# Patient Record
Sex: Female | Born: 1935
Health system: Southern US, Community
[De-identification: ages and names within clinical notes are randomized; demographics above are authoritative.]

## PROBLEM LIST (undated history)

## (undated) DIAGNOSIS — M1990 Unspecified osteoarthritis, unspecified site: Secondary | ICD-10-CM

## (undated) DIAGNOSIS — C55 Malignant neoplasm of uterus, part unspecified: Secondary | ICD-10-CM

## (undated) DIAGNOSIS — H269 Unspecified cataract: Secondary | ICD-10-CM

## (undated) DIAGNOSIS — K921 Melena: Secondary | ICD-10-CM

## (undated) HISTORY — DX: Unspecified osteoarthritis, unspecified site: M19.90

## (undated) HISTORY — PX: ABDOMINAL HYSTERECTOMY: SHX81

## (undated) HISTORY — DX: Malignant neoplasm of uterus, part unspecified: C55

---

## 2012-11-14 ENCOUNTER — Ambulatory Visit (INDEPENDENT_AMBULATORY_CARE_PROVIDER_SITE_OTHER)
Admission: RE | Admit: 2012-11-14 | Discharge: 2012-11-14 | Disposition: A | Discharge status: Home / Self Care | Payer: Medicare (Managed Care) | Source: Ambulatory Visit | Attending: Gastroenterology | Admitting: Gastroenterology

## 2012-11-14 ENCOUNTER — Encounter: Payer: Self-pay | Admitting: Gastroenterology

## 2012-11-14 ENCOUNTER — Ambulatory Visit (INDEPENDENT_AMBULATORY_CARE_PROVIDER_SITE_OTHER): Payer: Medicare (Managed Care) | Admitting: Gastroenterology

## 2012-11-14 ENCOUNTER — Other Ambulatory Visit (INDEPENDENT_AMBULATORY_CARE_PROVIDER_SITE_OTHER): Payer: Medicare (Managed Care)

## 2012-11-14 VITALS — BP 160/66 | HR 76 | Ht 62.0 in | Wt 156.0 lb

## 2012-11-14 DIAGNOSIS — R6889 Other general symptoms and signs: Secondary | ICD-10-CM

## 2012-11-14 DIAGNOSIS — Z8541 Personal history of malignant neoplasm of cervix uteri: Secondary | ICD-10-CM

## 2012-11-14 DIAGNOSIS — R197 Diarrhea, unspecified: Secondary | ICD-10-CM

## 2012-11-14 DIAGNOSIS — K6289 Other specified diseases of anus and rectum: Secondary | ICD-10-CM

## 2012-11-14 DIAGNOSIS — R933 Abnormal findings on diagnostic imaging of other parts of digestive tract: Secondary | ICD-10-CM

## 2012-11-14 DIAGNOSIS — R198 Other specified symptoms and signs involving the digestive system and abdomen: Secondary | ICD-10-CM

## 2012-11-14 LAB — COMPREHENSIVE METABOLIC PANEL
AST: 16 U/L (ref 0–37)
Albumin: 3.3 g/dL — ABNORMAL LOW (ref 3.5–5.2)
BUN: 13 mg/dL (ref 6–23)
CO2: 26 mEq/L (ref 19–32)
Calcium: 9.4 mg/dL (ref 8.4–10.5)
Chloride: 105 mEq/L (ref 96–112)
Creatinine, Ser: 0.7 mg/dL (ref 0.4–1.2)
GFR: 79.16 mL/min (ref >60.00)
Glucose, Bld: 95 mg/dL (ref 70–99)
Potassium: 3.8 mEq/L (ref 3.5–5.1)

## 2012-11-14 LAB — CBC WITH DIFFERENTIAL/PLATELET
Basophils Relative: 0.3 % (ref 0.0–3.0)
Eosinophils Absolute: 0.2 10*3/uL (ref 0.0–0.7)
Eosinophils Relative: 2.7 % (ref 0.0–5.0)
HCT: 35 % — ABNORMAL LOW (ref 36.0–46.0)
Hemoglobin: 11.7 g/dL — ABNORMAL LOW (ref 12.0–15.0)
Lymphocytes Relative: 16.4 % (ref 12.0–46.0)
Lymphs Abs: 1.3 10*3/uL (ref 0.7–4.0)
MCHC: 33.3 g/dL (ref 30.0–36.0)
Neutro Abs: 5.6 10*3/uL (ref 1.4–7.7)
Neutrophils Relative %: 70.5 % (ref 43.0–77.0)
RBC: 3.79 Mil/uL — ABNORMAL LOW (ref 3.87–5.11)

## 2012-11-14 MED ORDER — IOHEXOL 300 MG/ML  SOLN
100.0000 mL | Freq: Once | INTRAMUSCULAR | Status: AC | PRN
  Administered 2012-11-14: 100 mL via INTRAVENOUS

## 2012-11-14 MED ORDER — MOVIPREP 100 G PO SOLR: 1.0000 | Freq: Once | ORAL | Status: DC

## 2012-11-14 NOTE — Patient Instructions (Addendum)
You will have labs checked today in the basement lab.  Please head down after you check out with the front desk  (cbc, cmet, cea). You will be set up for a CT scan of abdomen and pelvis with IV and oral contrast and also chest CT with IV contrast for newly diagnosed rectal mass.  You have been scheduled for a CT scan of the abdomen and pelvis at [facility] ([address]---this is in the same building as Architectural technologist).   You are scheduled TODAY at 3 pm. You should arrive 15 minutes prior to your appointment time for registration. Please follow the written instructions below on the day of your exam:  WARNING: IF YOU ARE ALLERGIC TO IODINE/X-RAY DYE, PLEASE NOTIFY RADIOLOGY IMMEDIATELY AT [phone]! YOU WILL BE GIVEN A 13 HOUR PREMEDICATION PREP.  1) Do not eat or drink anything after 11 am (4 hours prior to your test) 2) You have been given 2 bottles of oral contrast to drink. The solution may taste better if refrigerated, but do NOT add ice or any other liquid to this solution. Shake well before drinking.    Drink 1 bottle of contrast @ 1 pm (2 hours prior to your exam)  Drink 1 bottle of contrast @ 2 pm (1 hour prior to your exam)  You may take any medications as prescribed with a small amount of water except for the following: Metformin, Glucophage, Glucovance, Avandamet, Riomet, Fortamet, Actoplus Met, Janumet, Glumetza or Metaglip. The above medications must be held the day of the exam AND 48 hours after the exam.  The purpose of you drinking the oral contrast is to aid in the visualization of your intestinal tract. The contrast solution may cause some diarrhea. Before your exam is started, you will be given a small amount of fluid to drink. Depending on your individual set of symptoms, you may also receive an intravenous injection of x-ray contrast/dye. Plan on being at [facility] for 30 minutes or long, depending on the type of exam you are having  performed.  This test typically takes 30-45 minutes to complete.  If you have any questions regarding your exam or if you need to reschedule, you may call the CT department at [phone] between the hours of 8:00 am and 5:00 pm, Monday-Friday.  ________________________________________________________________________  Bonita Quin will be set up for a colonoscopy +/- lower EUS at [facility] this Thursday (++MAC).

## 2012-11-14 NOTE — Progress Notes (Signed)
HPI: This is a  very pleasant 79 year old woman whom I am meeting for the first time today.  She is here with her daughter.  Lives in [place] during warm months and here in [place] during winter.  Her granddaughter is a good friend of Dr. Harden Mo who called me yesterday to help expedite her care.  She had uterine cancer, 12-15 years. She had surgery and XRT in [facility].  She is felt to have been cured.  She has had urgency with her bowels, loose stools. Has been going on for at least several months. There can be some overt red bleeding.  Minor ache at bottom, but no serious anal pains. No weight loss.    Never had colonoscopy.  No colon cancer in her family.  Was at her gyne surg office this past week, rectal examination done, felt to have rectal mass.   Review of systems: Pertinent positive and negative review of systems were noted in the above HPI section. Complete review of systems was performed and was otherwise normal.    Past Medical History  Diagnosis Date  . Arthritis   . Uterine cancer     History reviewed. No pertinent past surgical history.  Current Outpatient Prescriptions  Medication Sig Dispense Refill  . Cholecalciferol (VITAMIN D PO) Take by mouth. As directed      . Thiamine HCl (VITAMIN B-1 PO) Take by mouth. As directed       No current facility-administered medications for this visit.    Allergies as of 11/14/2012  . (No Known Allergies)    Family History  Problem Relation Age of Onset  . Uterine cancer Mother     History   Social History  . Marital Status: Widowed    Spouse Name: N/A    Number of Children: N/A  . Years of Education: N/A   Occupational History  . Not on file.   Social History Main Topics  . Smoking status: Never Smoker   . Smokeless tobacco: Not on file  . Alcohol Use: No  . Drug Use: No  . Sexual Activity: Not on file   Other Topics Concern  . Not on file   Social History Narrative  . No narrative on file        Physical Exam: BP 160/66  Pulse 76  Ht 5\' 2"  (1.575 m)  Wt 156 lb (70.761 kg)  BMI 28.53 kg/m2 Constitutional: Elderly but otherwise  generally well-appearing Psychiatric: alert and oriented x3, very minor memory difficulty Eyes: extraocular movements intact Mouth: oral pharynx moist, no lesions Neck: supple no lymphadenopathy Cardiovascular: heart regular rate and rhythm Lungs: clear to auscultation bilaterally Abdomen: soft, nontender, nondistended, no obvious ascites, no peritoneal signs, normal bowel sounds Extremities: no lower extremity edema bilaterally Skin: no lesions on visible extremities Rectal examination with female assistant in the room: Brown stools soiling the perineum, she is wearing an adult diaper. Small external anal hemorrhoids, there is a firm mass in the mid to distal rectum. The distal edge of this lady's about 5 cm from the anal verge on my examination. It does not seem to be circumferential on this exam but there was quite a lot of stool there so I cannot be certain.   Assessment and plan: 79 y.o. female with  likely rectal cancer  I will get labs today including CBC, complete metabolic profile, CEA level. I am arranging a staging workup including chest, abdomen, pelvic CT scan. She needs colonoscopy which I have arranged  for this Thursday and pending the results of her CT staging workup, I would also perform lower endoscopic ultrasound if it is appropriate (no overt metastatic disease on CT). Following these tests I will begin referral process to medical, surgical, radiation oncology as appropriate.

## 2012-11-15 ENCOUNTER — Telehealth (INDEPENDENT_AMBULATORY_CARE_PROVIDER_SITE_OTHER): Payer: Self-pay

## 2012-11-15 ENCOUNTER — Encounter (HOSPITAL_COMMUNITY): Payer: Self-pay | Admitting: Pharmacy Technician

## 2012-11-15 ENCOUNTER — Encounter (HOSPITAL_COMMUNITY): Payer: Self-pay | Admitting: *Deleted

## 2012-11-15 DIAGNOSIS — H269 Unspecified cataract: Secondary | ICD-10-CM

## 2012-11-15 DIAGNOSIS — K921 Melena: Secondary | ICD-10-CM

## 2012-11-15 HISTORY — DX: Melena: K92.1

## 2012-11-15 HISTORY — DX: Unspecified cataract: H26.9

## 2012-11-15 LAB — CEA: CEA: 12.8 ng/mL — ABNORMAL HIGH (ref 0.0–5.0)

## 2012-11-15 NOTE — Telephone Encounter (Signed)
Pt has appt with Dr. Christella Hartigan on 10/16 for a colonoscopy.  Will call her daughter tomorrow to discuss scheduling an appointment with Dr. Donell Beers.

## 2012-11-16 ENCOUNTER — Ambulatory Visit (HOSPITAL_COMMUNITY): Payer: Medicare (Managed Care) | Admitting: Anesthesiology

## 2012-11-16 ENCOUNTER — Encounter (HOSPITAL_COMMUNITY)
Admission: RE | Disposition: A | Discharge status: Home / Self Care | Payer: Self-pay | Source: Ambulatory Visit | Attending: Gastroenterology

## 2012-11-16 ENCOUNTER — Encounter (HOSPITAL_COMMUNITY): Payer: Self-pay

## 2012-11-16 ENCOUNTER — Telehealth: Payer: Self-pay

## 2012-11-16 ENCOUNTER — Ambulatory Visit (HOSPITAL_COMMUNITY)
Admission: RE | Admit: 2012-11-16 | Discharge: 2012-11-16 | Disposition: A | Discharge status: Home / Self Care | Payer: Medicare (Managed Care) | Source: Ambulatory Visit | Attending: Gastroenterology | Admitting: Gastroenterology

## 2012-11-16 ENCOUNTER — Encounter: Payer: Self-pay | Admitting: Radiation Oncology

## 2012-11-16 ENCOUNTER — Encounter (HOSPITAL_COMMUNITY): Payer: Medicare (Managed Care) | Admitting: Anesthesiology

## 2012-11-16 DIAGNOSIS — R198 Other specified symptoms and signs involving the digestive system and abdomen: Secondary | ICD-10-CM

## 2012-11-16 DIAGNOSIS — Z8542 Personal history of malignant neoplasm of other parts of uterus: Secondary | ICD-10-CM | POA: Insufficient documentation

## 2012-11-16 DIAGNOSIS — R933 Abnormal findings on diagnostic imaging of other parts of digestive tract: Secondary | ICD-10-CM

## 2012-11-16 DIAGNOSIS — R6889 Other general symptoms and signs: Secondary | ICD-10-CM

## 2012-11-16 DIAGNOSIS — M129 Arthropathy, unspecified: Secondary | ICD-10-CM | POA: Insufficient documentation

## 2012-11-16 DIAGNOSIS — Z8541 Personal history of malignant neoplasm of cervix uteri: Secondary | ICD-10-CM

## 2012-11-16 DIAGNOSIS — C20 Malignant neoplasm of rectum: Secondary | ICD-10-CM | POA: Insufficient documentation

## 2012-11-16 DIAGNOSIS — K6289 Other specified diseases of anus and rectum: Secondary | ICD-10-CM

## 2012-11-16 DIAGNOSIS — Z1211 Encounter for screening for malignant neoplasm of colon: Secondary | ICD-10-CM | POA: Insufficient documentation

## 2012-11-16 DIAGNOSIS — K5730 Diverticulosis of large intestine without perforation or abscess without bleeding: Secondary | ICD-10-CM | POA: Insufficient documentation

## 2012-11-16 DIAGNOSIS — R197 Diarrhea, unspecified: Secondary | ICD-10-CM

## 2012-11-16 HISTORY — PX: EUS: SHX5427

## 2012-11-16 HISTORY — DX: Unspecified cataract: H26.9

## 2012-11-16 HISTORY — PX: COLONOSCOPY WITH PROPOFOL: SHX5780

## 2012-11-16 HISTORY — DX: Melena: K92.1

## 2012-11-16 SURGERY — COLONOSCOPY WITH PROPOFOL
Anesthesia: Monitor Anesthesia Care

## 2012-11-16 MED ORDER — SPOT INK MARKER SYRINGE KIT
PACK | SUBMUCOSAL | Status: AC
  Filled 2012-11-16: qty 5

## 2012-11-16 MED ORDER — KETAMINE HCL 10 MG/ML IJ SOLN
INTRAMUSCULAR | Status: DC | PRN
  Administered 2012-11-16: 10 mg via INTRAVENOUS

## 2012-11-16 MED ORDER — SODIUM CHLORIDE 0.9 % IV SOLN: INTRAVENOUS | Status: DC

## 2012-11-16 MED ORDER — SPOT INK MARKER SYRINGE KIT
PACK | SUBMUCOSAL | Status: DC | PRN
  Administered 2012-11-16: 2 mL via SUBMUCOSAL

## 2012-11-16 MED ORDER — LACTATED RINGERS IV SOLN
INTRAVENOUS | Status: DC
  Administered 2012-11-16: 1000 mL via INTRAVENOUS

## 2012-11-16 MED ORDER — PROPOFOL 10 MG/ML IV BOLUS
INTRAVENOUS | Status: DC | PRN
  Administered 2012-11-16: 25 mg via INTRAVENOUS
  Administered 2012-11-16: 15 mg via INTRAVENOUS
  Administered 2012-11-16: 10 mg via INTRAVENOUS
  Administered 2012-11-16: 25 mg via INTRAVENOUS

## 2012-11-16 SURGICAL SUPPLY — 21 items

## 2012-11-16 NOTE — Telephone Encounter (Signed)
Message copied by Donata Duff on Thu Nov 16, 2012 11:58 AM ------      Message from: Rob Bunting P      Created: Thu Nov 16, 2012 11:33 AM       Alexia Freestone,      She needs referral to Drs. Mancel Bale and Dorothy Puffer at [facility] center for newly diagnosed (T3N0) rectal cancer. She should already have appt upcoming with Dr. Donell Beers.              Almira Coaster,       Can you put her on for Gi tumor board in 2 weeks (I'm out of town next week). Thanks            Matt,      [place]                  IMPRESSION:      5cm long, circumferential, uT3N0 rectal cancer with distal edge 1.5-2cm from the anal verge. The colon was otherwise normal. The rectal mass was biopsied and labled with [place] Ink injection.         ------

## 2012-11-16 NOTE — Anesthesia Preprocedure Evaluation (Addendum)
Anesthesia Evaluation  Patient identified by MRN, date of birth, ID band Patient awake    Reviewed: Allergy & Precautions, H&P , NPO status , Patient's Chart, lab work & pertinent test results  Airway Mallampati: III TM Distance: >3 FB Neck ROM: Full    Dental  (+) Teeth Intact and Dental Advisory Given   Pulmonary neg pulmonary ROS,  breath sounds clear to auscultation  Pulmonary exam normal       Cardiovascular negative cardio ROS  Rhythm:Regular Rate:Normal     Neuro/Psych negative neurological ROS  negative psych ROS   GI/Hepatic negative GI ROS, Neg liver ROS,   Endo/Other  negative endocrine ROS  Renal/GU negative Renal ROS   Hx of uterine CA    Musculoskeletal  (+) Arthritis -,   Abdominal   Peds  Hematology  (+) Blood dyscrasia, anemia ,   Anesthesia Other Findings   Reproductive/Obstetrics negative OB ROS                          Anesthesia Physical Anesthesia Plan  ASA: II  Anesthesia Plan: MAC   Post-op Pain Management:    Induction: Intravenous  Airway Management Planned: Nasal Cannula and Simple Face Mask  Additional Equipment:   Intra-op Plan:   Post-operative Plan:   Informed Consent: I have reviewed the patients History and Physical, chart, labs and discussed the procedure including the risks, benefits and alternatives for the proposed anesthesia with the patient or authorized representative who has indicated his/her understanding and acceptance.   Dental advisory given  Plan Discussed with: CRNA  Anesthesia Plan Comments:         Anesthesia Quick Evaluation

## 2012-11-16 NOTE — Telephone Encounter (Signed)
Stephanie Burns referrals are in [facility]

## 2012-11-16 NOTE — Op Note (Signed)
[facility] [address] [place], [address]   COLONOSCOPY PROCEDURE REPORT  PATIENT: Stephanie, Burns  MR#: [id number] BIRTHDATE: Oct 31, 1918 , 89  yrs. old GENDER: Female ENDOSCOPIST: Rachael Fee, MD REFERRED XB:[id number] Dwain Sarna, M.D. PROCEDURE DATE:  11/16/2012 PROCEDURE:   Colonoscopy with biopsy, Colonoscopy with EUS, and Submucosal injection, any substance First Screening Colonoscopy - Avg.  risk and is 50 yrs.  old or older - No.  Prior Negative Screening - Now for repeat screening. N/A  History of Adenoma - Now for follow-up colonoscopy & has been > or = to 3 yrs.  N/A  Polyps Removed Today? No.  Recommend repeat exam, <10 yrs? Yes.  High risk (family or personal hx). ASA CLASS:   Class III INDICATIONS:rectal mass; no sign of metastatic disease on CT scans.  MEDICATIONS: MAC sedation, administered by CRNA DESCRIPTION OF PROCEDURE:   After the risks benefits and alternatives of the procedure were thoroughly explained, informed consent was obtained.  A digital rectal exam revealed a palpable rectal mass.   The Pentax Pediatric Colonoscope [id number]  endoscope was introduced through the anus and advanced to the cecum, which was identified by both the appendix and ileocecal valve. No adverse events experienced.   The quality of the prep was good.  The instrument was then slowly withdrawn as the colon was fully examined.  COLON FINDINGS: There was a circumferntial, partially obstructing, clearly malignant appearing mass in distal rectum.  The mass is 5cm long, distal edge is 1.5-2cm from the anal verge.  There were diverticulum in the left colon.  The colon was otherwise normal. The rectal mass was sampled with mucosal biopsies and then the proximal and distal margins of the mass were labled with injection of [place] Ink. Following endoscopic examination, the radial echoendoscope was inserted.  The mass described above correlated with a  hypoechoic, heterogeneous mass that clearly passed into and through the muscularis propria layer of the rectal wall (uT3).  The mass was 2cm in thickness.  There were no suspicious perirectal lymphnodes (uN0).  Retroflexion was not performed. The time to cecum=3 minutes 00 seconds.  Withdrawal time=10 minutes 00 seconds.  The scope was withdrawn and the procedure completed. COMPLICATIONS: There were no complications. IMPRESSION: 5cm long, circumferential, uT3N0 rectal cancer with distal edge 1.5-2cm from the anal verge. The colon was otherwise normal. The rectal mass was biopsied and labled with [place] Ink injection.  RECOMMENDATIONS: Await final biopsy report.  She will likely benefit from neo-adjuvant chemo/XRT.  My office will arrange referral to Drs. [place] and [place]. She is already scheduled to meet with Dr. Donell Beers.  eSigned:  Rachael Fee, MD 11/16/2012 11:31 AM  cc:  Almond Lint, MD

## 2012-11-16 NOTE — Preoperative (Signed)
Beta Blockers   Reason not to administer Beta Blockers:Not Applicable 

## 2012-11-16 NOTE — Interval H&P Note (Signed)
History and Physical Interval Note:  11/16/2012 9:50 AM  Stephanie Burns  has presented today for surgery, with the diagnosis of Rectal mass [787.99]  The various methods of treatment have been discussed with the patient and family. After consideration of risks, benefits and other options for treatment, the patient has consented to  Procedure(s) with comments: COLONOSCOPY WITH PROPOFOL (N/A) LOWER ENDOSCOPIC ULTRASOUND (EUS) (N/A) - Possible EUS as a surgical intervention .  The patient's history has been reviewed, patient examined, no change in status, stable for surgery.  I have reviewed the patient's chart and labs.  Questions were answered to the patient's satisfaction.     Rachael Fee

## 2012-11-16 NOTE — Anesthesia Postprocedure Evaluation (Signed)
Anesthesia Post Note  Patient: Stephanie Burns  Procedure(s) Performed: Procedure(s) (LRB): COLONOSCOPY WITH PROPOFOL (N/A) LOWER ENDOSCOPIC ULTRASOUND (EUS) (N/A)  Anesthesia type: MAC  Patient location: PACU  Post pain: Pain level controlled  Post assessment: Post-op Vital signs reviewed  Last Vitals:  Filed Vitals:   11/16/12 1150  BP: 162/69  Pulse:   Temp:   Resp: 19    Post vital signs: Reviewed  Level of consciousness: sedated  Complications: No apparent anesthesia complications

## 2012-11-16 NOTE — H&P (View-Only) (Signed)
HPI: This is a  very pleasant 79-year-old woman whom I am meeting for the first time today.  She is here with her daughter.  Lives in [place] during warm months and here in [place] during winter.  Her granddaughter is a good friend of Dr. Matt Wakefield who called me yesterday to help expedite her care.  She had uterine cancer, 12-15 years. She had surgery and XRT in [place].  She is felt to have been cured.  She has had urgency with her bowels, loose stools. Has been going on for at least several months. There can be some overt red bleeding.  Minor ache at bottom, but no serious anal pains. No weight loss.    Never had colonoscopy.  No colon cancer in her family.  Was at her gyne surg office this past week, rectal examination done, felt to have rectal mass.   Review of systems: Pertinent positive and negative review of systems were noted in the above HPI section. Complete review of systems was performed and was otherwise normal.    Past Medical History  Diagnosis Date  . Arthritis   . Uterine cancer     History reviewed. No pertinent past surgical history.  Current Outpatient Prescriptions  Medication Sig Dispense Refill  . Cholecalciferol (VITAMIN D PO) Take by mouth. As directed      . Thiamine HCl (VITAMIN B-1 PO) Take by mouth. As directed       No current facility-administered medications for this visit.    Allergies as of 11/14/2012  . (No Known Allergies)    Family History  Problem Relation Age of Onset  . Uterine cancer Mother     History   Social History  . Marital Status: Widowed    Spouse Name: N/A    Number of Children: N/A  . Years of Education: N/A   Occupational History  . Not on file.   Social History Main Topics  . Smoking status: Never Smoker   . Smokeless tobacco: Not on file  . Alcohol Use: No  . Drug Use: No  . Sexual Activity: Not on file   Other Topics Concern  . Not on file   Social History Narrative  . No narrative on file        Physical Exam: BP 160/66  Pulse 76  Ht 5' 2" (1.575 m)  Wt 156 lb (70.761 kg)  BMI 28.53 kg/m2 Constitutional: Elderly but otherwise  generally well-appearing Psychiatric: alert and oriented x3, very minor memory difficulty Eyes: extraocular movements intact Mouth: oral pharynx moist, no lesions Neck: supple no lymphadenopathy Cardiovascular: heart regular rate and rhythm Lungs: clear to auscultation bilaterally Abdomen: soft, nontender, nondistended, no obvious ascites, no peritoneal signs, normal bowel sounds Extremities: no lower extremity edema bilaterally Skin: no lesions on visible extremities Rectal examination with female assistant in the room: Brown stools soiling the perineum, she is wearing an adult diaper. Small external anal hemorrhoids, there is a firm mass in the mid to distal rectum. The distal edge of this lady's about 5 cm from the anal verge on my examination. It does not seem to be circumferential on this exam but there was quite a lot of stool there so I cannot be certain.   Assessment and plan: 79 y.o. female with  likely rectal cancer  I will get labs today including CBC, complete metabolic profile, CEA level. I am arranging a staging workup including chest, abdomen, pelvic CT scan. She needs colonoscopy which I have arranged   for this Thursday and pending the results of her CT staging workup, I would also perform lower endoscopic ultrasound if it is appropriate (no overt metastatic disease on CT). Following these tests I will begin referral process to medical, surgical, radiation oncology as appropriate.     

## 2012-11-16 NOTE — Progress Notes (Signed)
GI Location of Tumor / Histology: Rectal mass  Patient presented several  months  Urgency,loose stools,some overt  Red bleeding,minor aching on bottom  Biopsies of rectal mass 11/16/12:Invasive poorly differentiated Adenocarcinoma Past/Anticipated interventions by surgeon, if ZOX:[id number] with biopsy 11/16/12 Dr. Rob Bunting, scheduled to see [facility] 11/17/12  Past/Anticipated interventions by medical oncology, if XBJ:YNWG Dr.Sherrill 11/24/12 Weight changes, if any: no Bowel/Bladder complaints, if any: loose stools, urgency,  Nausea / Vomiting, if any:No Pain issues, if any:No   SAFETY ISSUES:  Prior radiation?yes, Uterine cancer 12-15 years ago, in [place]  Pacemaker/ICD?no  Possible current pregnancy?no  Is the patient on methotrexate? No     Current Complaints / other details: widowed,lives in [place] summers, here during winters with daughter, no colon cancer in family non smoker,non drinker

## 2012-11-16 NOTE — Transfer of Care (Signed)
Immediate Anesthesia Transfer of Care Note  Patient: Stephanie Burns  Procedure(s) Performed: Procedure(s) with comments: COLONOSCOPY WITH PROPOFOL (N/A) LOWER ENDOSCOPIC ULTRASOUND (EUS) (N/A) - Possible EUS  Patient Location: PACU and Endoscopy Unit  Anesthesia Type:MAC  Level of Consciousness: awake, alert , oriented and patient cooperative  Airway & Oxygen Therapy: Patient Spontanous Breathing and Patient connected to face mask oxygen  Post-op Assessment: Report given to PACU RN, Post -op Vital signs reviewed and stable and Patient moving all extremities  Post vital signs: Reviewed and stable  Complications: No apparent anesthesia complications

## 2012-11-17 ENCOUNTER — Encounter (HOSPITAL_COMMUNITY): Payer: Self-pay | Admitting: Gastroenterology

## 2012-11-17 ENCOUNTER — Ambulatory Visit (INDEPENDENT_AMBULATORY_CARE_PROVIDER_SITE_OTHER): Payer: PRIVATE HEALTH INSURANCE | Admitting: General Surgery

## 2012-11-17 VITALS — BP 160/84 | HR 80 | Temp 98.4°F | Resp 15 | Ht 62.0 in | Wt 156.4 lb

## 2012-11-17 DIAGNOSIS — C20 Malignant neoplasm of rectum: Secondary | ICD-10-CM

## 2012-11-17 NOTE — Patient Instructions (Signed)
See Dr. Mitzi Hansen and Dr. Truett Perna next week.  We will also discuss you at GI conference next week.    Dr. Truett Perna will go over the final plan when you see him next Friday.

## 2012-11-17 NOTE — Assessment & Plan Note (Addendum)
Given the fact that this rectal cancer is still low, I think that she would need an APR. Because of her age and her patulous sphincter tone, did not think she would do well with attempt at sphincter sparing surgery.  I think the best plan would be to do chemoradiation and see if we get complete clinical response. Think that if we can get a good response, she would be reasonable to hold off on surgery.  She is reasonably frail, and I think that she would be at very high risk for prolonged post op recovery and complications.    Because of her prior uterine radiation, she may not be able to receive radiation therapy. I will see what Dr. Mitzi Hansen says after his evaluation.  30 min spent in evaluation, examination, counseling, and coordination of care.    We'll discuss her next week in GI conference.

## 2012-11-17 NOTE — Progress Notes (Signed)
Chief Complaint  Patient presents with  . New Evaluation    per dr Carita Sollars/ rectal cancer    HISTORY: Patient is a 79 year old female with a history of uterine cancer who presented with loose stools and fecal urgency.  She had never undergone colonoscopy. On physical exam, she was found to have a rectal mass. She underwent endoscopic ultrasound and colonoscopy yesterday. She did have a malignant appearing mass at 1.5 cm from the anal verge. This was found to be an ultrasound T3 lesion. She did not specifically have rectal pain, but does have discomfort, aching, and spasm. She denies any obstructive symptoms. She also denies bloating. She has had some bloody stools. She thinks she may have lost a few pounds, but her close did not seem to fit that much looser. She does feel a little bit weaker than she had previously, that she has attributed this to age. She lives in [place] during the summer and comes down here and lives with her daughter during the winter.  Past Medical History  Diagnosis Date  . Passage of bloody stools 11-15-12    occ.  . Arthritis     fingers  . Uterine cancer     12-15 years ago  . Cataract 11-15-12    one eye    Past Surgical History  Procedure Laterality Date  . Abdominal hysterectomy    . Colonoscopy with propofol N/A 11/16/2012    Procedure: COLONOSCOPY WITH PROPOFOL;  Surgeon: Rachael Fee, MD;  Location: [facility];  Service: Endoscopy;  Laterality: N/A;  . Eus N/A 11/16/2012    Procedure: LOWER ENDOSCOPIC ULTRASOUND (EUS);  Surgeon: Rachael Fee, MD;  Location: [facility];  Service: Endoscopy;  Laterality: N/A;  Possible EUS    Current Outpatient Prescriptions  Medication Sig Dispense Refill  . Cholecalciferol (VITAMIN D-3) 1000 UNITS CAPS Take 1,000 Units by mouth daily.      . vitamin B-12 (CYANOCOBALAMIN) 1000 MCG tablet Take 1,000 mcg by mouth daily.       No current facility-administered medications for this visit.     No Known  Allergies   Family History  Problem Relation Age of Onset  . Uterine cancer Mother   . Cancer Mother     cant remember     History   Social History  . Marital Status: Widowed    Spouse Name: N/A    Number of Children: N/A  . Years of Education: N/A   Social History Main Topics  . Smoking status: Never Smoker   . Smokeless tobacco: Never Used  . Alcohol Use: No     Comment: very rare wine  . Drug Use: No  . Sexual Activity: Not Currently    REVIEW OF SYSTEMS - PERTINENT POSITIVES ONLY: 12 point review of systems negative other than HPI and PMH except for rectal discomfort and leg weakness.    [facility]:   11/17/12 0925  BP: 160/84  Pulse: 80  Temp: 98.4 F (36.9 C)  Resp: 15   Filed Weights   11/17/12 0925  Weight: 156 lb 6.4 oz (70.943 kg)     Gen:  No acute distress.  Well nourished and well groomed.   Neurological: Alert and oriented to person, place, and time. Moves slowly.    Head: Normocephalic and atraumatic.  Eyes: Conjunctivae are normal. Pupils are equal, round, and reactive to light. No scleral icterus.  Neck: Normal range of motion. Neck supple. No tracheal deviation or thyromegaly present.  Cardiovascular: Normal rate, regular rhythm, and intact distal pulses.  Exam reveals no gallop and no friction rub. +3 systolic murmur.   Respiratory: Effort normal.  No respiratory distress. No chest wall tenderness. Breath sounds normal.  No wheezes, rales or rhonchi.  GI: Soft. Bowel sounds are normal. The abdomen is soft and nontender.  There is no rebound and no guarding.  Musculoskeletal: Normal range of motion. Extremities are nontender.  Lymphadenopathy: No cervical, preauricular, postauricular or axillary adenopathy is present Skin: Skin is warm and dry. No rash noted. No diaphoresis. No erythema. No pallor. No clubbing, cyanosis, or edema.   Psychiatric: Normal mood and affect. Behavior is normal. Judgment and thought content normal.     LABORATORY RESULTS: Available labs are reviewed  Albumin 3.3, Electrolytes normal, HCT 35.0, CEA 12.8  RADIOLOGY RESULTS: See E-Chart or I-Site for most recent results.  Images and reports are reviewed. CT chest/abd/pelvis \\IMPRESSION :  1. Calcified left hilar lymph nodes suggest prior granulomatous  disease or other benign process.  2. Moderate wall thickening of most of the rectum. Findings are  concerning for malignancy. Infection or nonspecific inflammatory  change of the bowel is considered less likely    ASSESSMENT AND PLAN: Rectal cancer Given the fact that this rectal cancer is still low, I think that she would need an APR. Because of her age and her patulous sphincter tone, did not think she would do well with attempt at sphincter sparing surgery.  I think the best plan would be to do chemoradiation and see if we get complete clinical response. Think that if we can get a good response, she would be reasonable to hold off on surgery.  She is reasonably frail, and I think that she would be at very high risk for prolonged post op recovery and complications.    Because of her prior uterine radiation, she may not be able to receive radiation therapy. I will see what Dr. Mitzi Hansen says after his evaluation.  30 min spent in evaluation, examination, counseling, and coordination of care.    We'll discuss her next week in GI conference.      Maudry Diego MD Surgical Oncology, General and Endocrine Surgery [facility]      Visit Diagnoses: 1. Rectal cancer     Primary Care Physician: Carlis Abbott, MD

## 2012-11-20 ENCOUNTER — Telehealth: Payer: Self-pay | Admitting: *Deleted

## 2012-11-20 ENCOUNTER — Ambulatory Visit
Admission: RE | Admit: 2012-11-20 | Discharge: 2012-11-20 | Disposition: A | Discharge status: Home / Self Care | Payer: Medicare (Managed Care) | Source: Ambulatory Visit | Attending: Radiation Oncology | Admitting: Radiation Oncology

## 2012-11-20 ENCOUNTER — Encounter: Payer: Self-pay | Admitting: Radiation Oncology

## 2012-11-20 ENCOUNTER — Ambulatory Visit: Payer: Medicare (Managed Care)

## 2012-11-20 VITALS — BP 176/62 | HR 80 | Temp 97.6°F | Resp 20 | Wt 155.0 lb

## 2012-11-20 DIAGNOSIS — C20 Malignant neoplasm of rectum: Secondary | ICD-10-CM

## 2012-11-20 DIAGNOSIS — Z923 Personal history of irradiation: Secondary | ICD-10-CM | POA: Insufficient documentation

## 2012-11-20 DIAGNOSIS — Z8542 Personal history of malignant neoplasm of other parts of uterus: Secondary | ICD-10-CM | POA: Insufficient documentation

## 2012-11-20 NOTE — Progress Notes (Signed)
Please see the Nurse Progress Note in the MD Initial Consult Encounter for this patient. 

## 2012-11-20 NOTE — Telephone Encounter (Signed)
Left voice message for patient home phone [phone] asking clarification of where she reeceived her radiation tx years ago, per note stated "[facility]",  Per Oretha Milch message she had a call stating no films from [facility] 11:12 AM

## 2012-11-20 NOTE — Progress Notes (Signed)
Radiation Oncology         [phone] ________________________________  Name: BURNA ATLAS MRN: [id number]  Date: 11/20/2012  DOB: 1918-10-26  [id number], Zenaida Niece, MD  Rachael Fee, MD     REFERRING PHYSICIAN: Rachael Fee, MD   DIAGNOSIS: The encounter diagnosis was Rectal cancer.   HISTORY OF PRESENT ILLNESS::Stephanie Burns is a 79 y.o. female who is seen for an initial consultation visit. The patient is a had a history of some loose stools and fecal urgency. This led to further workup and on exam the patient was found to have a rectal mass.  The patient proceeded with a colonoscopy with biopsy well as an endorectal ultrasound. This revealed a 5 cm long circumferential tumor with the distal edge approximately 1-1/2-2 cm from the anal verge. The rectal mass was biopsied and labeled with any. Based on ultrasound this represented a T3 N0 tumor.  The patient further underwent CT imaging with a scan of the chest/abdomen/pelvis. This was most significant for moderate wall thickening of most of the rectum which was consistent with malignancy. The patient today states that she has in general been in good health. She maintains her house and does her grocery shopping. The patient describes a pressure in the anal region associated with some rectal urgency. Some occasional blood per act him with bowel movements.     PREVIOUS RADIATION THERAPY: Yes the patient was treated with radiation for uterine cancer approximately 12-15 years ago in [facility]. The radiation records for this treatment have been requested. From discussing this with the patient it appears that she was treated for approximately 6 weeks and I am assuming from her description that she received whole pelvic external beam radiation as part of her overall treatment plan.   PAST MEDICAL HISTORY:  has a past medical history of Passage of bloody stools (11-15-12); Arthritis; Uterine cancer; and Cataract (11-15-12).      PAST SURGICAL HISTORY: Past Surgical History  Procedure Laterality Date  . Abdominal hysterectomy    . Colonoscopy with propofol N/A 11/16/2012    Procedure: COLONOSCOPY WITH PROPOFOL;  Surgeon: Rachael Fee, MD;  [facility]: [facility];  Service: Endoscopy;  Laterality: N/A;  . Eus N/A 11/16/2012    Procedure: LOWER ENDOSCOPIC ULTRASOUND (EUS);  Surgeon: Rachael Fee, MD;  Location: [facility];  Service: Endoscopy;  Laterality: N/A;  Possible EUS     FAMILY HISTORY: family history includes Cancer in her mother; Uterine cancer in her mother.   SOCIAL HISTORY:  reports that she has never smoked. She has never used smokeless tobacco. She reports that she does not drink alcohol or use illicit drugs.   ALLERGIES: Review of patient's allergies indicates no known allergies.   MEDICATIONS:  Current Outpatient Prescriptions  Medication Sig Dispense Refill  . Cholecalciferol (VITAMIN D-3) 1000 UNITS CAPS Take 1,000 Units by mouth daily.      . vitamin B-12 (CYANOCOBALAMIN) 1000 MCG tablet Take 1,000 mcg by mouth daily.       No current facility-administered medications for this encounter.     REVIEW OF SYSTEMS:  A 15 point review of systems is documented in the electronic medical record. This was obtained by the nursing staff. However, I reviewed this with the patient to discuss relevant findings and make appropriate changes.  Pertinent items are noted in HPI.    PHYSICAL EXAM:  weight is 155 lb (70.308 kg). Her temperature is 97.6 F (36.4 C). Her blood pressure is 176/62 and  her pulse is 80. Her respiration is 20.   General: Well-developed, in no acute distress HEENT: Normocephalic, atraumatic; oral cavity clear Neck: Supple without any lymphadenopathy Cardiovascular: Regular rate and rhythm Respiratory: Clear to auscultation bilaterally GI: Soft, nontender, normal bowel sounds Extremities: No edema present Neuro: No focal deficits Rectal: A circumferential firm mass  is present approximately 2 cm from the anal verge. Moderate narrowing present. No external findings. No blood on exam glove.    LABORATORY DATA:  Lab Results  Component Value Date   WBC 8.0 11/14/2012   HGB 11.7* 11/14/2012   HCT 35.0* 11/14/2012   MCV 92.4 11/14/2012   PLT 271.0 11/14/2012   Lab Results  Component Value Date   NA 139 11/14/2012   K 3.8 11/14/2012   CL 105 11/14/2012   CO2 26 11/14/2012   Lab Results  Component Value Date   ALT 12 11/14/2012   AST 16 11/14/2012   ALKPHOS 82 11/14/2012   BILITOT 0.6 11/14/2012      RADIOGRAPHY: Ct Chest W Contrast  11/14/2012   CLINICAL DATA:  Newly diagnosed rectal mass, history of ovarian carcinoma 12 - 15 years ago; loose stools for past several months  EXAM: CT CHEST, ABDOMEN, AND PELVIS WITH CONTRAST  TECHNIQUE: Multidetector CT imaging of the chest, abdomen and pelvis was performed following the standard protocol during bolus administration of intravenous contrast.  CONTRAST:  OMNIPAQUE IOHEXOL 300 MG/ML  SOLN  COMPARISON:  None.  FINDINGS: CT CHEST FINDINGS  The lungs are clear. Thoracic inlet is normal. There is calcification of the thoracic aorta diffusely. There is an 18 mm calcified left hilar lymph node. There are no pleural effusions. There is no noncalcified enlarged mediastinal or hilar adenopathy. The bony thorax is intact.  CT ABDOMEN AND PELVIS FINDINGS  The liver shows mild diffuse fatty infiltration but is otherwise normal. Gallbladder, spleen, pancreas, and adrenal glands are normal. Kidneys are normal except for a 2 cm cyst in the posterior midpole on the right. This demonstrates an average attenuation value of 4 post contrast.  The abdominal aorta is calcified with an infrarenal abdominal aortic aneurysm of 38 x 37 mm. There is a nonobstructive bowel gas pattern. Bladder is normal except for mild diffuse wall thickening. Reproductive organs appear to be surgically absent. Bowel is normal except for diffuse  wall thickening of the rectum. This extends from the distal rectum to the junction of the sigmoid colon and rectum. There is mild increased attenuation surrounding the posterior aspect of the mid to distal rectum and mild increased attenuation in the presacral space. There is no significant abdominal or pelvic adenopathy.  There is an old fracture of the inferior right pubic ramus. There are no acute musculoskeletal findings.  IMPRESSION: 1. Calcified left hilar lymph nodes suggest prior granulomatous disease or other benign process. 2. Moderate wall thickening of most of the rectum. Findings are concerning for malignancy. Infection or nonspecific inflammatory change of the bowel is considered less likely.   Electronically Signed   By: Esperanza Heir M.D.   On: 11/14/2012 15:42   Ct Abdomen Pelvis W Contrast  11/14/2012   CLINICAL DATA:  Newly diagnosed rectal mass, history of ovarian carcinoma 12 - 15 years ago; loose stools for past several months  EXAM: CT CHEST, ABDOMEN, AND PELVIS WITH CONTRAST  TECHNIQUE: Multidetector CT imaging of the chest, abdomen and pelvis was performed following the standard protocol during bolus administration of intravenous contrast.  CONTRAST:  OMNIPAQUE IOHEXOL  300 MG/ML  SOLN  COMPARISON:  None.  FINDINGS: CT CHEST FINDINGS  The lungs are clear. Thoracic inlet is normal. There is calcification of the thoracic aorta diffusely. There is an 18 mm calcified left hilar lymph node. There are no pleural effusions. There is no noncalcified enlarged mediastinal or hilar adenopathy. The bony thorax is intact.  CT ABDOMEN AND PELVIS FINDINGS  The liver shows mild diffuse fatty infiltration but is otherwise normal. Gallbladder, spleen, pancreas, and adrenal glands are normal. Kidneys are normal except for a 2 cm cyst in the posterior midpole on the right. This demonstrates an average attenuation value of 4 post contrast.  The abdominal aorta is calcified with an infrarenal abdominal  aortic aneurysm of 38 x 37 mm. There is a nonobstructive bowel gas pattern. Bladder is normal except for mild diffuse wall thickening. Reproductive organs appear to be surgically absent. Bowel is normal except for diffuse wall thickening of the rectum. This extends from the distal rectum to the junction of the sigmoid colon and rectum. There is mild increased attenuation surrounding the posterior aspect of the mid to distal rectum and mild increased attenuation in the presacral space. There is no significant abdominal or pelvic adenopathy.  There is an old fracture of the inferior right pubic ramus. There are no acute musculoskeletal findings.  IMPRESSION: 1. Calcified left hilar lymph nodes suggest prior granulomatous disease or other benign process. 2. Moderate wall thickening of most of the rectum. Findings are concerning for malignancy. Infection or nonspecific inflammatory change of the bowel is considered less likely.   Electronically Signed   By: Esperanza Heir M.D.   On: 11/14/2012 15:42       IMPRESSION: The patient clinically is presenting with a new diagnosis of T3, N0, M0 rectal cancer. She has a history of uterine cancer and did receive approximately 6 weeks of external beam radiotherapy per her history. Additional records from [facility] pending. The patient has been seen by Dr. Donell Beers in surgical oncology and she is scheduled to see Dr. Truett Perna and medical oncology later this week.   standard treatment for this scenario would include neoadjuvant chemoradiotherapy followed by surgical resection. Given the patient's history of radiation treatment as well as her age, I believe that she would be at significant increased risk for complications after a course of radiation treatment to the pelvis. This is based on  the broad fields of treatment/dose and the degree of overlap with the radiation plan at this point. This is my thought at this time although I am still waiting  detailed radiation treatment records.  The patient's case will be discussed in multidisciplinary GI conference later this week. Given her age, certainly she has increased risk for a number of issues with other treatment options. An individualized multidisciplinary approach therefore will be critical for her care.   PLAN: No clinic appointment scheduled at this time. We will discuss her case in multidisciplinary conference and I will follow her case for the time being.    I spent 60 minutes face to face with the patient and more than 50% of that time was spent in counseling and/or coordination of care.    ________________________________   Radene Gunning, MD, PhD

## 2012-11-20 NOTE — Telephone Encounter (Signed)
Pt returned call stated surgery at [facility], chemo and radiation here at [place],called Rhonda flynt  to check s-drive 1:61 PM

## 2012-11-24 ENCOUNTER — Telehealth: Payer: Self-pay | Admitting: Oncology

## 2012-11-24 ENCOUNTER — Ambulatory Visit: Payer: Medicare Other

## 2012-11-24 ENCOUNTER — Encounter: Payer: Medicare Other | Admitting: Nutrition

## 2012-11-24 ENCOUNTER — Ambulatory Visit (HOSPITAL_BASED_OUTPATIENT_CLINIC_OR_DEPARTMENT_OTHER): Payer: Medicare (Managed Care) | Admitting: Oncology

## 2012-11-24 VITALS — BP 175/59 | HR 91 | Temp 98.5°F | Resp 18 | Ht 62.0 in | Wt 154.6 lb

## 2012-11-24 DIAGNOSIS — C20 Malignant neoplasm of rectum: Secondary | ICD-10-CM

## 2012-11-24 DIAGNOSIS — Z8542 Personal history of malignant neoplasm of other parts of uterus: Secondary | ICD-10-CM

## 2012-11-24 DIAGNOSIS — K6289 Other specified diseases of anus and rectum: Secondary | ICD-10-CM

## 2012-11-24 NOTE — Progress Notes (Signed)
Met with Stephanie Burns and family. Explained role of nurse navigator. Educational information provided on colorectal cancer  [facility] resources provided to patient, including SW service information.  Contact names and phone numbers were provided for entire [facility] team.  Teach back method was used.  Treatment plan still TBD.  No barriers to care identified.  Will continue to follow as needed.

## 2012-11-24 NOTE — Progress Notes (Signed)
[facility] New Patient Consult   Referring MD: Stephanie Burns 79 y.o.  1918-05-15    Reason for Referral: Rectal cancer     HPI: She complains of rectal pressure and urgency for the past several months. She is also constipated. Stephanie gynecologist noted a rectal mass. She was referred to Dr. Christella Hartigan.  On 11/16/2012 she was taken to a colonoscopy and endoscopic ultrasound. A circumferential partially obstructing mass was noted in the distal rectum. The distal edge of the mass was 1.5-2 cm from the anal verge. Diverticuli were noted in the left colon. The colon was otherwise normal. The rectal mass was biopsied. The proximal and distal margins of the mass were tattooed. On ultrasound the mass appeared to pass into and through the muscularis propria and there were no suspicious perirectal lymph nodes.  The pathology ([id number]) revealed invasive poorly differentiated adenocarcinoma. Inner she was referred for CTs of the chest, abdomen, and pelvis on 11/14/2012. The lungs appear clear. Mild fatty infiltration of the liver. Absence of reproductive organs. Diffuse wall thickening of the rectum extending from the distal rectum to the junction of the sigmoid colon and rectum. No significant abdominal pelvic adenopathy.  She saw Dr. Donell Beers and is not felt to be a good candidate for sphincter sparing surgery. She saw Dr. Mitzi Hansen to consider radiation.    Past Medical History  Diagnosis Date  .  G1 P1        . Arthritis     fingers  . Uterine cancer     12-15 years ago  . Cataract 11-15-12    one eye   .    Bilateral hearing loss  Past Surgical History  Procedure Laterality Date  . Abdominal hysterectomy    . Colonoscopy with propofol N/A 11/16/2012    Procedure: COLONOSCOPY WITH PROPOFOL;  Surgeon: Rachael Fee, MD;  Location: [facility];  Service: Endoscopy;  Laterality: N/A;  . Eus N/A 11/16/2012    Procedure: LOWER ENDOSCOPIC ULTRASOUND (EUS);   Surgeon: Rachael Fee, MD;  Location: [facility];  Service: Endoscopy;  Laterality: N/A;  Possible EUS    Family History  Problem Relation Age of Onset  .  cancer-cannot be more specific Mother   .           Current outpatient prescriptions:Cholecalciferol (VITAMIN D-3) 1000 UNITS CAPS, Take 1,000 Units by mouth daily., Disp: , Rfl: ;  vitamin B-12 (CYANOCOBALAMIN) 1000 MCG tablet, Take 1,000 mcg by mouth daily., Disp: , Rfl:   Allergies: No Known Allergies  Social History:  she lives in [place] and is living with Stephanie Burns here for the winter. She is a Futures trader. She does not use tobacco. No transfusion history. No risk factor for HIV or hepatitis.   ROS:   Positives include: frequent sneezing, rectal urgency and pressure, constipation, intermittent rectal bleeding, loose stool   A complete ROS was otherwise negative.  Physical Exam:  Blood pressure 175/59, pulse 91, temperature 98.5 F (36.9 C), temperature source Oral, resp. rate 18, height 5\' 2"  (1.575 m), weight 154 lb 9.6 oz (70.126 kg).  HEENT: Oropharynx without visible mass, neck without mass  Lungs: Clear bilaterally  Cardiac: Regular rate and rhythm , 2/6 systolic murmur Abdomen:  no hepatosplenomegaly, nontender, no mass  Rectal: There is a firm mass a few centimeters proximal to the anal verge  Vascular:  no leg edema Lymph nodes:  no cervical, supra-clavicular, axillary, or inguinal nodes Neurologic:  Alert and oriented, the motor exam is intact in the upper and lower extremities  Skin:  no rash    LAB:  CBC  Lab Results  Component Value Date   WBC 8.0 11/14/2012   HGB 11.7* 11/14/2012   HCT 35.0* 11/14/2012   MCV 92.4 11/14/2012   PLT 271.0 11/14/2012    ANC 5.6  CMP      Component Value Date/Time   NA 139 11/14/2012 1232   K 3.8 11/14/2012 1232   CL 105 11/14/2012 1232   CO2 26 11/14/2012 1232   GLUCOSE 95 11/14/2012 1232   BUN 13 11/14/2012 1232   CREATININE 0.7 11/14/2012  1232   CALCIUM 9.4 11/14/2012 1232   PROT 6.5 11/14/2012 1232   ALBUMIN 3.3* 11/14/2012 1232   AST 16 11/14/2012 1232   ALT 12 11/14/2012 1232   ALKPHOS 82 11/14/2012 1232   BILITOT 0.6 11/14/2012 1232   CEA 12.8  Radiology: as per history of present illness    Assessment/Plan:   1. Rectal cancer, clinical stage II (uT3,uN0)  2. Elevated CEA  3. Rectal urgency/pressure secondary to #1  4. Remote history of uterine cancer treated with surgery and adjuvant radiation   Disposition:    Ms.  Burns has been diagnosed with rectal cancer. I discussed treatment options with the patient and Stephanie Burns. We discussed the "standard" approach to neoadjuvant therapy and surgery. She would like to avoid a colostomy if at all possible.  Stephanie case was presented at the multidisciplinary gastrointestinal oncology conference on 11/22/2012. Dr. Donell Beers does not feel she is a candidate for sphincter sparing surgery. The recommendation is to proceed with chemotherapy/radiation with the hope of achieving a clinical remission and palliation of Stephanie symptoms.   I recommend concurrent capecitabine and radiation if she is a candidate for additional radiation. I will discuss the case with Dr. Mitzi Hansen when he has reviewed the radiation records from [facility]. If she is not a radiation candidate then we can consider transanal debulking of the tumor versus a diverting colostomy. We can also consider a multiagent chemotherapy regimen which would have a significant chance of shrinking the tumor. However this would not be curative and would have the potential for significant toxicity.  I reviewed the potential toxicities associated with capecitabine including the chance for a rash, hyperpigmentation, the hand/foot syndrome, mucositis, and diarrhea. She will attend a chemotherapy teaching class.  We will contact Stephanie Burns after Dr. Mitzi Hansen has reviewed the radiation records from [facility] and Stephanie case is again  presented at the GI tumor conference on 11/29/2012.  Approximately 50 minutes were spent with the patient today. The majority of the time was used for counseling and coordination of care.  Stephanie Burns 11/24/2012, 6:19 PM

## 2012-11-24 NOTE — Telephone Encounter (Signed)
Gave pt appt for chemo class and MD on October and November 2014

## 2012-11-28 ENCOUNTER — Inpatient Hospital Stay: Payer: Medicare (Managed Care)

## 2012-11-28 ENCOUNTER — Encounter: Payer: Self-pay | Admitting: *Deleted

## 2012-11-30 ENCOUNTER — Encounter: Payer: Self-pay | Admitting: *Deleted

## 2012-11-30 NOTE — Progress Notes (Signed)
[facility] Psychosocial Distress Screening Clinical Social Work  Clinical Social Work was referred by distress screening protocol.  The patient scored a 4 on the Psychosocial Distress Thermometer which indicates mild distress.  Clinical Social Worker follow up needed: no  Doreen Salvage, LCSW Clinical Social Worker [facility] Wednesday, Thursday and Friday Phone: [phone] Fax: [phone]

## 2012-12-01 ENCOUNTER — Telehealth: Payer: Self-pay | Admitting: *Deleted

## 2012-12-01 ENCOUNTER — Other Ambulatory Visit: Payer: Self-pay | Admitting: *Deleted

## 2012-12-01 NOTE — Telephone Encounter (Addendum)
Received phone nmessage from patient's daughter requesting that her mother be seen sooner than on 12/19/12.  Appointment was given for 12/14/12.

## 2012-12-12 ENCOUNTER — Telehealth: Payer: Self-pay | Admitting: *Deleted

## 2012-12-12 NOTE — Telephone Encounter (Signed)
Dr. Macario Golds wants tests sent out to [facility] for further testing. To be able to do this, they need blood drawn and put in purple top tube to send with the sample.

## 2012-12-13 ENCOUNTER — Telehealth: Payer: Self-pay | Admitting: *Deleted

## 2012-12-13 NOTE — Telephone Encounter (Signed)
Left voice message for patient re: appointment for 12/14/12 at 1:30.  Call back number provided.

## 2012-12-14 ENCOUNTER — Ambulatory Visit (HOSPITAL_BASED_OUTPATIENT_CLINIC_OR_DEPARTMENT_OTHER): Payer: Medicare (Managed Care) | Admitting: Nurse Practitioner

## 2012-12-14 ENCOUNTER — Other Ambulatory Visit: Payer: Self-pay | Admitting: Lab

## 2012-12-14 ENCOUNTER — Telehealth: Payer: Self-pay | Admitting: Oncology

## 2012-12-14 VITALS — BP 152/61 | HR 87 | Temp 97.8°F | Resp 18 | Ht 62.0 in | Wt 150.4 lb

## 2012-12-14 DIAGNOSIS — Z8542 Personal history of malignant neoplasm of other parts of uterus: Secondary | ICD-10-CM

## 2012-12-14 DIAGNOSIS — C20 Malignant neoplasm of rectum: Secondary | ICD-10-CM

## 2012-12-14 DIAGNOSIS — R152 Fecal urgency: Secondary | ICD-10-CM

## 2012-12-14 DIAGNOSIS — R970 Elevated carcinoembryonic antigen [CEA]: Secondary | ICD-10-CM

## 2012-12-14 NOTE — Telephone Encounter (Signed)
gv and pritned appt sched and avs forpt for DEC...lvm for Dr. Michaell Cowing office to sched appt and to call me and pt with d/t

## 2012-12-14 NOTE — Patient Instructions (Signed)
Low-Fiber Diet °Fiber is found in fruits, vegetables, and grains. A low-fiber diet restricts fibrous foods that are not digested in the small intestine. A diet containing about 10 grams of fiber is considered low fiber.  °PURPOSE °· To prevent blockage of a partially obstructed or narrowed gastrointestinal tract. °· To reduce fecal weight and volume. °· To slow the movement of feces. °WHEN IS THIS DIET USED? °· It may be used during the acute phase of Crohn disease, ulcerative colitis, regional enteritis, or diverticulitis. °· It may be used if your intestinal or esophageal tubes are narrowing (stenosis). °· It may be used as a transitional diet following surgery, injury (trauma), or illness. °CHOOSING FOODS °Check labels, especially on foods from the starch list. Often times, dietary fiber content is listed on the nutrition facts panel. Please ask your Registered Dietitian if you have questions about specific foods that are related to your condition, especially if the food is not listed on this handout. °Breads and Starches °· Allowed: White, French, and pita breads, plain rolls, buns, or sweet rolls, doughnuts, waffles, pancakes, bagels. Plain muffins, biscuits, matzoth. Soda, saltine, graham crackers. Pretzels, rusks, melba toast, zwieback. Cooked cereals: cornmeal, farina, or cream cereals. Dry cereals: refined corn, wheat, rice, and oat cereals (check label). Potatoes prepared any way without skins, refined macaroni, spaghetti, noodles, refined rice. °· Avoid: Whole-wheat bread, rolls, and crackers. Multigrains, rye, bran seeds, nuts, or coconut. Cereals containing whole grains, multigrains, bran, coconut, nuts, raisins. Cooked or dry oatmeal. Coarse wheat cereals, granola. Cereals advertised as "high fiber." Potato skins. Whole-grain pasta, wild or brown rice. Popcorn. °Vegetables °· Allowed: Strained tomato and vegetable juices. Fresh lettuce, cucumber, spinach. Well-cooked or canned: asparagus, bean sprouts,  broccoli, cut green beans, cauliflower, pumpkin, beets, mushrooms, yellow squash, tomato, tomato sauce, zucchini, turnips. Keep servings limited to ½ cup. °· Avoid: Fresh, cooked, or canned: artichokes, baked beans, beet greens, Brussels sprouts, corn, kale, legumes, peas, sweet potatoes. Avoid large servings of any vegetables. °Fruit °· Allowed: All fruit juices except prune juice. Cooked or canned fruits without skin and seeds: apricots, applesauce, cantaloupe, cherries, grapefruit, grapes, kiwi, mandarin oranges, peaches, pears, fruit cocktail, pineapple, plums, watermelon. Fresh without skin: banana, grapes, cantaloupe, avocado, cherries, pineapple, kiwi, nectarines, peaches, blueberries. Keep servings limited to ½ cup or 1 piece. °· Avoid: Fresh: apples with or without skin, apricots, mangoes, pears, raspberries, strawberries. Prune juice and juices with pulp, stewed or dried prunes. Dried fruits, raisins, dates. Avoid large servings of all fresh fruits. °Meat and Protein Substitutes °· Allowed: Ground or well-cooked tender beef, ham, veal, lamb, pork, poultry. Eggs, plain cheese. Fish, oysters, shrimp, lobster, other seafood. Liver, organ meats. Smooth nut butters. °· Avoid: Tough, fibrous meats with gristle. Chunky nut butter. Cheese with seeds, nuts, or other foods not allowed. Nuts, seeds, legumes, dried peas, beans, lentils. °Dairy °· Allowed: All milk products except those not allowed. °· Avoid: Yogurt or cheese that contains nuts, seeds, or added fruit.  °Soups and Combination Foods °· Allowed: Bouillon, broth, or cream soups made from allowed foods. Any strained soup. Casseroles or mixed dishes made with allowed foods. °· Avoid: Soups made from vegetables that are not allowed or that contain other foods not allowed. °Desserts and Sweets °· Allowed: Plain cakes and cookies, pie made with allowed fruit, pudding, custard, cream pie. Gelatin, fruit, ice, sherbet, frozen ice pops. Ice cream, ice milk without  nuts. Plain hard candy, honey, jelly, molasses, syrup, sugar, chocolate syrup, gumdrops, marshmallows. °· Avoid: Desserts, cookies, or candies that contain   nuts, peanut butter, dried fruits. Jams, preserves with seeds, marmalade. °Fats and Oils °· Allowed: Margarine, butter, cream, mayonnaise, salad oils, plain salad dressings made from allowed foods. °· Avoid: Seeds, nuts, olives.  °Beverages °· Allowed: All, except those listed to avoid. °· Avoid: Fruit juices with high pulp, prune juice. °Condiments °· Allowed: Ketchup, mustard, horseradish, vinegar, cream sauce, cheese sauce, cocoa powder. Spices in moderation: allspice, basil, bay leaves, celery powder or leaves, cinnamon, cumin powder, curry powder, ginger, mace, marjoram, onion or garlic powder, oregano, paprika, parsley flakes, ground pepper, rosemary, sage, savory, tarragon, thyme, turmeric. °· Avoid: Coconut, pickles. °SAMPLE MENU °Breakfast °· ½ cup orange juice. °· 1 boiled egg. °· 1 slice white toast. °· Margarine. °· ¾ cup cornflakes. °· 1 cup milk. °· Beverage. °Lunch °· ½ cup chicken noodle soup. °· 2 to 3 oz sliced roast beef. °· 2 slices white bread. °· Mayonnaise. °· ½ cup tomato juice. °· 1 small banana. °· Beverage. °Dinner °· 3 oz baked chicken. °· ½ cup scalloped potatoes. °· ½ cup cooked beets. °· White dinner roll. °· Margarine. °· ½ cup canned peaches. °· Beverage. °Document Released: 07/10/2001 Document Revised: 09/20/2012 Document Reviewed: 02/04/2011 °[employer]® Patient Information ©2014 [employer]. ° °

## 2012-12-14 NOTE — Progress Notes (Addendum)
OFFICE PROGRESS NOTE  Interval history:  Stephanie Burns returns for followup of recently diagnosed clinical stage II rectal cancer. She continues to have rectal pressure and urgency. She intermittently has rectal bleeding. Overall good appetite. No shortness of breath or cough. No fevers. She denies nausea/vomiting.   Objective: Blood pressure 152/61, pulse 87, temperature 97.8 F (36.6 C), temperature source Oral, resp. rate 18, height 5\' 2"  (1.575 m), weight 150 lb 6.4 oz (68.221 kg), SpO2 98.00%.  Lungs are clear. Regular cardiac rhythm. 2/6 systolic murmur. Abdomen soft and nontender. No hepatomegaly. Trace lower leg edema bilaterally. Calves soft and nontender.  Lab Results: Lab Results  Component Value Date   WBC 8.0 11/14/2012   HGB 11.7* 11/14/2012   HCT 35.0* 11/14/2012   MCV 92.4 11/14/2012   PLT 271.0 11/14/2012    Chemistry:    Chemistry      Component Value Date/Time   NA 139 11/14/2012 1232   K 3.8 11/14/2012 1232   CL 105 11/14/2012 1232   CO2 26 11/14/2012 1232   BUN 13 11/14/2012 1232   CREATININE 0.7 11/14/2012 1232      Component Value Date/Time   CALCIUM 9.4 11/14/2012 1232   ALKPHOS 82 11/14/2012 1232   AST 16 11/14/2012 1232   ALT 12 11/14/2012 1232   BILITOT 0.6 11/14/2012 1232       Studies/Results: No results found.  Medications: I have reviewed the patient's current medications.  Assessment/Plan:  1. Rectal cancer, clinical stage II (uT3, uN0). 2. Elevated CEA. 3. Rectal pressure/urgency secondary to #1. 4. Remote history of uterine cancer treated with surgery and adjuvant radiation.  Disposition-Dr. Truett Perna reviewed with Stephanie Burns that she is not a candidate to receive additional radiation. Transanal excision of the tumor possibly followed by Xeloda chemotherapy was discussed. She is interested in meeting with Dr. Michaell Cowing to discuss the surgical procedure.   We gave her information on a low residue diet.  She will return for a  followup visit here in 4 weeks. She will contact the office in the interim with any problems.  Patient seen with Dr. Truett Perna.  30 minutes were spent face-to-face at today's visit with the majority of that time involved in counseling/coordination of care.  Stephanie Burns ANP/GNP-BC   This was a shared visit with Stephanie Burns. We discussed treatment options with Stephanie Burns and her daughter.Dr. Mitzi Hansen does not recommend additional radiation. Her case was presented at the GI tumor conference.  Treatment optionsinclude primary resection of the tumor with a colostomy, palliative transanal excision, and systemic therapy. She would like to avoid a colostomy. She is interested in the potential of a palliative transanal resection. We will refer her to Dr. Michaell Cowing to get his opinion.   Mancel Bale, M.D.

## 2012-12-15 ENCOUNTER — Telehealth: Payer: Self-pay | Admitting: Oncology

## 2012-12-15 NOTE — Telephone Encounter (Signed)
s.w. pt family member and advised on Dr. Michaell Cowing appt on 11.26.14 @ 11:30am

## 2012-12-19 ENCOUNTER — Ambulatory Visit: Payer: Medicare (Managed Care) | Admitting: Nurse Practitioner

## 2012-12-25 ENCOUNTER — Encounter (HOSPITAL_COMMUNITY): Payer: Self-pay

## 2012-12-25 ENCOUNTER — Telehealth (INDEPENDENT_AMBULATORY_CARE_PROVIDER_SITE_OTHER): Payer: Self-pay

## 2012-12-25 NOTE — Telephone Encounter (Signed)
LMOM asking for the pt to call me back. I need for the pt to come in earlier on 12/27/12 for her appt with Dr Michaell Cowing. I need for her to come in at 8:30 for the 8:45 new pt spot that I have blocked for her now on Dr Gordy Savers schedule. The pt is already down for 11:30 but he wants to see her sooner this day per Dr Michaell Cowing.

## 2012-12-25 NOTE — Telephone Encounter (Signed)
Pt's daughter returned my call and agreed to come in at 8:30 for the 8:45 appt on 12/27/12.

## 2012-12-27 ENCOUNTER — Ambulatory Visit (INDEPENDENT_AMBULATORY_CARE_PROVIDER_SITE_OTHER): Payer: Medicare (Managed Care) | Admitting: Surgery

## 2012-12-27 ENCOUNTER — Encounter (INDEPENDENT_AMBULATORY_CARE_PROVIDER_SITE_OTHER): Payer: Self-pay | Admitting: Surgery

## 2012-12-27 ENCOUNTER — Ambulatory Visit (INDEPENDENT_AMBULATORY_CARE_PROVIDER_SITE_OTHER): Payer: Self-pay | Admitting: Surgery

## 2012-12-27 VITALS — BP 150/90 | HR 88 | Temp 98.7°F | Resp 15 | Ht 65.0 in | Wt 151.0 lb

## 2012-12-27 DIAGNOSIS — R152 Fecal urgency: Secondary | ICD-10-CM | POA: Insufficient documentation

## 2012-12-27 DIAGNOSIS — C20 Malignant neoplasm of rectum: Secondary | ICD-10-CM

## 2012-12-27 DIAGNOSIS — R159 Full incontinence of feces: Secondary | ICD-10-CM | POA: Insufficient documentation

## 2012-12-27 DIAGNOSIS — K429 Umbilical hernia without obstruction or gangrene: Secondary | ICD-10-CM | POA: Insufficient documentation

## 2012-12-27 NOTE — Progress Notes (Signed)
Subjective:     Patient ID: Stephanie Burns, female   DOB: 09-07-1918, 79 y.o.   MRN: [id number]  HPI  Stephanie Burns  12-14-1918 [id number]  Patient Care Team: Nancy Marus as PCP - General (Obstetrics and Gynecology) Ladene Artist, MD as Consulting Physician (Oncology) Ardeth Sportsman, MD as Consulting Physician (General Surgery) Jonna Coup, MD as Consulting Physician (Radiation Oncology) Rachael Fee, MD as Attending Physician (Gastroenterology)  This patient is a 79 y.o.female who presents today for surgical evaluation at the request of Dr. Truett Perna.   Reason for visit: Second opinion on a rectal cancer.  Consideration of local excision/control  Pleasant active elderly woman.  She comes today with her daughter.  Was found to have a rectal mass By her gynecologist ? up in [place].  Was sent to gastroenterology.  Dr. Christella Hartigan confirmed a circumferential rectal mass with biopsy showing adenocarcinoma.  Surgical consultation requested.  Felt to be too distal to tolerate sinker sparing surgery.  Recommended consideration of chemoradiation therapy.  Patient had already received maximal pelvic radiation from a prior uterine cancer therapy.  Surgical consultation requested to see a local excision could be done to help palliate the mass.  Patient was not interested in major abdominoperineal resection.  She would like to avoid a colostomy.  She does note some achiness in her pelvis, but no severe sharp pains.  Occasional pressure.  Does have some feeling of fecal urgency where it is hard to hold in stool when it comes.  Initially some liquid stool with flatus.  Wears a diaper now.  She is rather active.  She can walk in the mall for about 15 minutes straight before having to stop due to feeling tired.  Her weight has been stable.  She has had some occasional blood but no severe rectal bleeding.  No episodes of severe bloating/nausea/vomiting.  Patient Active Problem List   Diagnosis  Date Noted  . Fecal incontinence - mild overflow/urgency 12/27/2012  . Fecal urgency 12/27/2012  . Umbilical hernia - 2cm reducible 12/27/2012  . Rectal cancer - distal circumferential, uT3uN0 11/17/2012    Past Medical History  Diagnosis Date  . Passage of bloody stools 11-15-12    occ.  . Arthritis     fingers  . Uterine cancer     12-15 years ago  . Cataract 11-15-12    one eye    Past Surgical History  Procedure Laterality Date  . Abdominal hysterectomy    . Colonoscopy with propofol N/A 11/16/2012    Procedure: COLONOSCOPY WITH PROPOFOL;  Surgeon: Rachael Fee, MD;  Location: [facility];  Service: Endoscopy;  Laterality: N/A;  . Eus N/A 11/16/2012    Procedure: LOWER ENDOSCOPIC ULTRASOUND (EUS);  Surgeon: Rachael Fee, MD;  Location: [facility];  Service: Endoscopy;  Laterality: N/A;  Possible EUS    History   Social History  . Marital Status: Widowed    Spouse Name: N/A    Number of Children: 1  . Years of Education: N/A   Occupational History  . Not on file.   Social History Main Topics  . Smoking status: Never Smoker   . Smokeless tobacco: Never Used  . Alcohol Use: No     Comment: very rare wine  . Drug Use: No  . Sexual Activity: Not Currently   Other Topics Concern  . Not on file   Social History Narrative   Widowed   Lives alone in [place] but  comes to [facility] for the winter.   Has only one daughter whom lives in [place]    Family History  Problem Relation Age of Onset  . Uterine cancer Mother   . Cancer Mother     cant remember    Current Outpatient Prescriptions  Medication Sig Dispense Refill  . Cholecalciferol (VITAMIN D-3) 1000 UNITS CAPS Take 1,000 Units by mouth daily.      . vitamin B-12 (CYANOCOBALAMIN) 1000 MCG tablet Take 1,000 mcg by mouth daily.       No current facility-administered medications for this visit.     No Known Allergies  BP 150/90  Pulse 88  Temp(Src) 98.7 F (37.1 C) (Temporal)  Resp 15  Ht 5\' 5"   (1.651 m)  Wt 151 lb (68.493 kg)  BMI 25.13 kg/m2  No results found.   Review of Systems  Constitutional: Negative for fever, chills, diaphoresis, appetite change and fatigue.  HENT: Negative for ear discharge, ear pain, sore throat and trouble swallowing.   Eyes: Negative for photophobia, discharge and visual disturbance.  Respiratory: Negative for cough, choking, chest tightness and shortness of breath.   Cardiovascular: Negative for chest pain and palpitations.  Gastrointestinal: Positive for anal bleeding. Negative for nausea, vomiting, abdominal pain, diarrhea, constipation, blood in stool and abdominal distention.  Endocrine: Negative for cold intolerance and heat intolerance.  Genitourinary: Positive for pelvic pain. Negative for dysuria, frequency, hematuria, decreased urine volume, vaginal bleeding, vaginal discharge, difficulty urinating and vaginal pain.  Musculoskeletal: Positive for arthralgias. Negative for back pain, gait problem, myalgias and neck pain.  Skin: Negative for color change, pallor and rash.  Allergic/Immunologic: Negative for environmental allergies, food allergies and immunocompromised state.  Neurological: Negative for dizziness, speech difficulty, weakness and numbness.  Hematological: Negative for adenopathy.  Psychiatric/Behavioral: Negative for confusion and agitation. The patient is not nervous/anxious.        Objective:   Physical Exam  Constitutional: She is oriented to person, place, and time. She appears well-developed and well-nourished. No distress.  HENT:  Head: Normocephalic.  Mouth/Throat: Oropharynx is clear and moist. No oropharyngeal exudate.  Eyes: Conjunctivae and EOM are normal. Pupils are equal, round, and reactive to light. No scleral icterus.  Neck: Normal range of motion. Neck supple. No tracheal deviation present.  Cardiovascular: Normal rate, regular rhythm and intact distal pulses.   Pulmonary/Chest: Effort normal and breath  sounds normal. No stridor. No respiratory distress. She exhibits no tenderness.  Abdominal: Soft. She exhibits no distension and no mass. There is no tenderness. A hernia is present. Hernia confirmed positive in the ventral area. Hernia confirmed negative in the right inguinal area and confirmed negative in the left inguinal area.    Genitourinary: No vaginal discharge found.  Exam done with assistance of female Medical Assistant in the room.  Perianal skin with old liquid stool.  No pruritis.  No pilonidal disease.  No fissure.  No abscess/fistula.  No external skin tags / hemorrhoids of significance.  Tolerates digital anoscopic rectal exam.  Normal sphincter tone.  Hemorrhoidal piles WNL.  Circumferential distal rectal mass with significant narrowing.  Distal extent 2 cm from anal verge.  Fixed to rectovaginal septum.  No breech into the vaginal canal.   Musculoskeletal: Normal range of motion. She exhibits no tenderness.       Right elbow: She exhibits normal range of motion.       Left elbow: She exhibits normal range of motion.       Right wrist: She  exhibits normal range of motion.       Left wrist: She exhibits normal range of motion.       Right hand: Normal strength noted.       Left hand: Normal strength noted.  Lymphadenopathy:       Head (right side): No posterior auricular adenopathy present.       Head (left side): No posterior auricular adenopathy present.    She has no cervical adenopathy.    She has no axillary adenopathy.       Right: No inguinal adenopathy present.       Left: No inguinal adenopathy present.  Neurological: She is alert and oriented to person, place, and time. No cranial nerve deficit. She exhibits normal muscle tone. Coordination normal.  Skin: Skin is warm and dry. No rash noted. She is not diaphoretic. No erythema.  Psychiatric: She has a normal mood and affect. Her behavior is normal. Judgment and thought content normal.       Assessment:       Significant distal rectal cancer.  Mild symptoms/incontinence associated with it.     Plan:     This is a challenging situation.  I discussed with my partners, Dr. Johna Sheriff and Dr. Rayburn Ma.  I was present when this was discussed at GI tumor board.  This tumor is long & circumferential and fixed to the rectovaginal septum.  I do not think any debulking or local resection by TEM is possible.  I think it is too distal to tolerate a rectal stenting.  The most aggressive option would be laparoscopically assisted abdominal perineal resection with permanent colostomy.  While a sphincter sparing for surgery might be technically feasible, I worry that she would not handle a coloanal anastomosis well.  I worry that a temporary loop ileostomy could be permanent and not tolerated well - ARF/dehydration, etc.  Ideally neoadjuvant chemoradiation therapy were done beforehand to help shrink this down.  Radiation is not an option.  Could start with chemotherapy first.  I am hesitant to offer her major resection in a 79 year old.  However, she does have some functional status.  I did discuss with them in detail:  The anatomy & physiology of the digestive tract was discussed.  The pathophysiology was discussed.  Natural history risks without surgery was discussed.   I worked to give an overview of the disease and the frequent need to have multispecialty involvement.  I feel the risks of no intervention will lead to serious problems that outweigh the operative risks; therefore, I recommended a partial proctocolectomy to remove the pathology.  Laparoscopic & open techniques were discussed.  We will work to preserve anal & pelvic floor function without sacrificing cure - high likelihood of APR.Marland Kitchen  Risks such as bleeding, infection, abscess, leak, reoperation, possible ostomy, hernia, heart attack, death, and other risks were discussed.  I noted a good likelihood this will help address the problem.   Goals of post-operative  recovery were discussed as well.  We will work to minimize complications.  An educational handout on the pathology was given as well.  Questions were answered.    The patient expresses understanding & wishes to avoid surgery.  Another option would be a palliative diverting loop colostomy.  More like a primarily end colostomy with small mucus fistula.  I feel that the need for this will be imminent as it is already partially obstructing and she is struggling with some incontinent issues.  Unless the chemotherapy can control it,  these will most likely progressed to the point of worsening pain or complete obstruction.  She again would like to avoid this.  At this point, she could start with chemotherapy (oral Xeloda/capecitabine) and see how she tolerates it.  See if there is some tumor response to palliate her symptoms.  Do colostomy diversion as a backup option.  I tried to plan for seed of considering colostomy to better palliate her symptoms at some point.  She seemed to dislikes that option but I did not get the sense that it was adamantly opposed.    She cannot decide today.  I did not have luck reaching today, but I will try to reach Dr. Truett Perna again.  I will discuss again with a few more of my partners as well.

## 2012-12-27 NOTE — Patient Instructions (Signed)
Consider Xeloda chemotherapy pills to control the rectal cancer.  Resection or debridement/debulking of the mass is not a good option for local control only.  He will require a major abdominal perineal resection with permanent colostomy to give the best shot at cure.  Another option is diverting colostomy to help lower the issues of incontinence, bleeding, pain, and blockage from the tumor  Colorectal Cancer Colorectal cancer is an abnormal growth of tissue (tumor) in the colon or rectum that is cancerous (malignant). Unlike noncancerous (benign) tumors, malignant tumors can spread to other parts of your body. The colon is the large bowel or large intestine. The rectum is the last several inches of the colon.  RISK FACTORS The exact cause of colorectal cancer is unknown. However, the following factors may increase your chances of getting colorectal cancer:   Age older than 50 years.   Abnormal growths (polyps) on the inner wall of the colon or rectum.   Diabetes.   African American race.   Family history of hereditary nonpolyposis colorectal cancer. This condition is caused by changes in the genes that are responsible for repairing mismatched DNA.   Personal history of cancer. A person who has already had colorectal cancer may develop it a second time. Also, women with a history of ovarian, uterine, or breast cancer are at a somewhat higher risk of developing colorectal cancer.  Certain hereditary conditions.  Eating a diet that is high in fat (especially animal fat) and low in fiber, fruits, and vegetables.  Sedentary lifestyle.  Inflammatory bowel disease, including ulcerative colitis and Crohn disease.   Smoking.   Excessive alcohol use.  SYMPTOMS Early colorectal cancer often does not cause symptoms. As the cancer grows, symptoms may include:   Changes in bowel habits.  Diarrhea.   Constipation.   Feeling like the bowel does not empty completely after a bowel  movement.   Blood in the stool.   Stools that are narrower than usual.   Abdominal discomfort, pain, bloating, fullness, or cramps.  Frequent gas pain.   Unexplained weight loss.   Constant tiredness.   Nausea and vomiting.  DIAGNOSIS  Your health care provider will ask about your medical history. He or she may also perform a number of procedures, such as:   A physical exam.  A digital rectal exam.  A fecal occult blood test.  A barium enema.  Blood tests.   X-rays.   Imaging tests, such as CT scans or MRIs.   Taking a tissue sample (biopsy) from your colon or rectum to look for cancer cells.   A sigmoidoscopy to view the inside of the last part of your colon.   A colonoscopy to view the inside of your entire colon.   An endorectal ultrasound to see how deep a rectal tumor has grown and whether the cancer has spread to lymph nodes or other nearby tissues.  Your cancer will be staged to determine its severity and extent. Staging is a careful attempt to find out the size of the tumor, whether the cancer has spread, and if so, to what parts of the body. You may need to have more tests to determine the stage of your cancer. The test results will help determine what treatment plan is best for you.   Stage 0 The cancer is found only in the innermost lining of the colon or rectum.   Stage I The cancer has grown into the inner wall of the colon or rectum. The cancer has  not yet reached the outer wall of the colon.   Stage II The cancer extends more deeply into or through the wall of the colon or rectum. It may have invaded nearby tissue, but cancer cells have not spread to the lymph nodes.   Stage III The cancer has spread to nearby lymph nodes but not to other parts of the body.   Stage IV The cancer has spread to other parts of the body, such as the liver or lungs.  Your health care provider may tell you the detailed stage of your cancer, which  includes both a number and a letter.  TREATMENT  Depending on the type and stage, colorectal cancer may be treated with surgery, radiation therapy, chemotherapy, targeted therapy, or radiofrequency ablation. Some people have a combination of these therapies. Surgery may be done to remove the polyps from your colon. In early stages, your health care provider may be able to do this during a colonoscopy. In later stages, surgery may be done to remove part of your colon.  HOME CARE INSTRUCTIONS   Only take over-the-counter or prescription medicines for pain, discomfort, or fever as directed by your health care provider.   Maintain a healthy diet.   Consider joining a support group. This may help you learn to cope with the stress of having colorectal cancer.   Seek advice to help you manage treatment of side effects.   Keep all follow-up appointments as directed by your health care provider.   Inform your cancer specialist if you are admitted to the hospital.  SEEK MEDICAL CARE IF:  Your diarrhea or constipation does not go away.   Your bowel habits change.  You have increased abdominal pain.   You notice new fatigue or weakness.  You lose weight. Document Released: 01/18/2005 Document Revised: 09/20/2012 Document Reviewed: 07/13/2012 [facility] Patient Information 2014 [place], [place].  Ostomy Support Information  Yes, you've heard that people get along just fine with only one of their eyes, or one of their lungs, or one of their kidneys. But you also know that you have only one intestine and only one bladder, and that leaves you feeling awfully empty, both physically and emotionally: You think no other people go around without part of their intestine with the ends of their intestines sticking out through their abdominal walls.  Well, you are wrong! There are nearly three quarters of a million people in the [place] who have an ostomy; people who have had surgery to remove all or part  of their colons or bladders. There is even a national association, the [place] [employer] [place] with over 350 local affiliated support groups that are organized by volunteers who provide peer support and counseling. Cheral Marker has a toll free telephone num-ber, 323-596-2511 and an educational,  interactive website, www.ostomy.org   An ostomy is an opening in the belly (abdominal wall) made by surgery. Ostomates are people who have had this procedure. The opening (stoma) allows the kidney or bowel to discharge waste. An external pouch covers the stoma to collect waste. Pouches are are a simple bag and are odor free. Different companies have disposable or reusable pouches to fit one's lifestyle. An ostomy can either be temporary or permanent.  THERE ARE THREE MAIN TYPES OF OSTOMIES  Colostomy. A colostomy is a surgically created opening in the large intestine (colon).  Ileostomy. An ileostomy is a surgically created opening in the small intestine.  Urostomy. A urostomy is a surgically created opening to divert  urine away from the bladder. FREQUENTLY ASKED QUESTIONS   Why haven't you met any of these folks who have an ostomy?  Well, maybe you have! You just did not recognize them because an ostomy doesn't show. It can be kept secret if you wish. Why, maybe some of your best friends, office associates or neighbors have an ostomy ... you never can tell.   People facing ostomy surgery have many quality-of-life questions like:  Will you bulge? Smell? Make noises? Will you feel waste leaving your body? Will you be a captive of the toilet? Will you starve? Be a social outcast? Get/stay married? Have babies? Easily bathe, go swimming, bend over?  OK, let's look at what you can expect:  Will you bulge?  Remember, without part of the intestine or bladder, and its contents, you should have a flatter tummy than before. You can expect to wear, with little exception, what you wore before surgery ... and  this in-cludes tight clothing and bathing suits.  Will you smell?  Today, thanks to modern odor proof pouching systems, you can walk into an ostomy support group meeting and not smell anything that is foul or offensive. And, for those with an ileostomy or colostomy who are concerned about odor when emptying their pouch, there are in-pouch deodorants that can be used to eliminate any waste odors that may exist.  Will you make noises?  Everyone produces gas, especially if they are an air-swallower. But intestinal sounds that occur from time to time are no differ-ent than a gurgling tummy, and quite often your clothing will muffle any sounds.   Will you feel the waste discharges?  For those with a colostomy or ileostomy there might be a slight pressure when waste leaves your body, but understand that the intestines have no nerve endings, so there will be no unpleasant sensations. Those with a urostomy will probably be unaware of any kidney drainage.  Will you be a captive of the toilet?  Immediately post-op you will spend more time in the bathroom than you will after your body recovers from surgery. Every person is different, but on average those with an ileostomy or urostomy may empty their pouches 4 to 6 times a day; a little  less if you have a colostomy. The average wear time between pouch system changes is 3 to 5 days and the changing process should take less than 30 minutes.  Will I need to be on a special diet? Most people return to their normal diet when they have recovered from surgery. Be sure to chew your food well, eat a well-balanced diet and drink plenty of fluids. If you experience problems with a certain food, wait a couple of weeks and try it again. Will there be odor and noises? Pouching systems are designed to be odor-proof or odor-resistant. There are deodorants that can be used in the pouch. Medications are also available to help reduce odor. Limit gas-producing foods and carbonated  beverages. You will experience less gas and fewer noises as you heal from surgery. How much time will it take to care for my ostomy? At first, you may spend a lot of time learning about your ostomy and how to take care of it. As you become more comfortable and skilled at changing the pouching system, it will take very little time to care for it.  Will I be able to return to work? People with ostomies can perform most jobs. As soon as you have healed from surgery,  you should be able to return to work. Heavy lifting (more than 10 pounds) may be discouraged.  What about intimacy? Sexual relationships and intimacy are important and fulfilling aspects of your life. They should continue after ostomy surgery. Intimacy-related concerns should be discussed openly between you and your partner.  Can I wear regular clothing? You do not need to wear special clothing. Ostomy pouches are fairly flat and barely noticeable. Elastic undergarments will not hurt the stoma or prevent the ostomy from functioning.  Can I participate in sports? An ostomy should not limit your involvement in sports. Many people with ostomies are runners, skiers, swimmers or participate in other active lifestyles. Talk with your caregiver first before doing heavy physical activity.  Will you starve?  Not if you follow doctor's orders at each stage of your post-op adjustment. There is no such thing as an "ostomy diet". Some people with an ostomy will be able to eat and tolerate anything; others may find diffi-culty with some foods. Each person is an individual and must determine, by trial, what is best for them. A good practice for all is to drink plenty of water.  Will you be a social outcast?  Have you met anyone who has an ostomy and is a social outcast? Why should you be the first? Only your attitude and self image will effect how you are treated. No confi-dent person is an Investment banker, corporate.   PROFESSIONAL HELP  Resources are available if you  need help or have questions about your ostomy.    Specially trained nurses called Wound, Ostomy Continence Nurses (WOCN) are available for consultation in most major medical centers.   Consider getting an ostomy consult with Page Spiro at [facility] to help troubleshoot collagen fittings and other issues with your ostomy: [phone]   The United Ostomy Association (UOA) is a group made up of many local chapters throughout the [place]. These local groups hold meetings and provide support to prospective and existing ostomates. They sponsor educational events and have qualified visitors to make personal or telephone visits. Contact the UOA for the chapter nearest you and for other educational publications.  More detailed information can be found in Colostomy Guide, a publication of the [employer] (UOA). Contact UOA at 1-365-193-2165 or visit their web site at YellowSpecialist.at. The website contains links to other sites, suppliers and resources. Document Released: 01/21/2003 Document Revised: 04/12/2011 Document Reviewed: 05/22/2008 [facility] Patient Information 2013 [place], [place].

## 2013-01-03 ENCOUNTER — Telehealth: Payer: Self-pay | Admitting: *Deleted

## 2013-01-03 ENCOUNTER — Telehealth (INDEPENDENT_AMBULATORY_CARE_PROVIDER_SITE_OTHER): Payer: Self-pay

## 2013-01-03 NOTE — Telephone Encounter (Signed)
Message copied by Ethlyn Gallery on Wed Jan 03, 2013 10:27 AM ------      Message from: Rise Paganini      Created: Mon Jan 01, 2013  3:03 PM      RegardingLeanne Chang: [phone]       Patient's daughter(Gerry Guido Sander) called inquiring about a discussion between Dr. Michaell Cowing and Dr. Truett Perna and what the next step will be. I informed the daughter that Elease Hashimoto was out of the office today and would return in the morning. I advised the daughter that I would cc Jade in regards to this message as well. Please call to discuss. Thank you. ------

## 2013-01-03 NOTE — Telephone Encounter (Signed)
Called to notify daughter that Dr Michaell Cowing did speak with Dr Truett Perna and that the pt has an appt with Dr Truett Perna on 01/11/13. Dr Truett Perna will go over with the pt again about chemo vs. Surgery. The daughter understands.

## 2013-01-03 NOTE — Telephone Encounter (Signed)
Received call from patient's daughter, Bo Merino.  She would like to know the treatment plan for her mother and if Dr. Truett Perna and Dr. Michaell Cowing have been able to discuss it.  This RN spoke with Dr. Truett Perna.  He said he plans to discuss  Treatment with Xeloda  next week at the 01/11/13 visit.  The patient's daughter was inquiring about talking with Dr.Gross re: surgical options.  She asked this RN to contact Dr.Gross to see if he would call her and inform her what his recommendation at this point is.  This RN will call his office and request a call be made to the patient's daughter.

## 2013-01-08 ENCOUNTER — Other Ambulatory Visit: Payer: Self-pay | Admitting: *Deleted

## 2013-01-08 MED ORDER — CAPECITABINE 500 MG PO TABS: ORAL_TABLET | ORAL | Status: DC

## 2013-01-09 ENCOUNTER — Encounter: Payer: Self-pay | Admitting: Oncology

## 2013-01-09 NOTE — Progress Notes (Signed)
Faxed xeloda prescription to [facility] °

## 2013-01-11 ENCOUNTER — Ambulatory Visit (HOSPITAL_BASED_OUTPATIENT_CLINIC_OR_DEPARTMENT_OTHER): Payer: Medicare (Managed Care)

## 2013-01-11 ENCOUNTER — Telehealth: Payer: Self-pay | Admitting: Oncology

## 2013-01-11 ENCOUNTER — Ambulatory Visit (HOSPITAL_BASED_OUTPATIENT_CLINIC_OR_DEPARTMENT_OTHER): Payer: Medicare (Managed Care) | Admitting: Oncology

## 2013-01-11 VITALS — BP 159/64 | HR 82 | Temp 97.9°F | Resp 18 | Ht 65.0 in | Wt 149.5 lb

## 2013-01-11 DIAGNOSIS — C20 Malignant neoplasm of rectum: Secondary | ICD-10-CM

## 2013-01-11 DIAGNOSIS — R970 Elevated carcinoembryonic antigen [CEA]: Secondary | ICD-10-CM

## 2013-01-11 DIAGNOSIS — R152 Fecal urgency: Secondary | ICD-10-CM

## 2013-01-11 LAB — CBC WITH DIFFERENTIAL/PLATELET
Basophils Absolute: 0.1 10*3/uL (ref 0.0–0.1)
Eosinophils Absolute: 0.2 10*3/uL (ref 0.0–0.5)
HCT: 35.5 % (ref 34.8–46.6)
HGB: 11.7 g/dL (ref 11.6–15.9)
LYMPH%: 11.9 % — ABNORMAL LOW (ref 14.0–49.7)
MCV: 92.5 fL (ref 79.5–101.0)
MONO#: 1.1 10*3/uL — ABNORMAL HIGH (ref 0.1–0.9)
MONO%: 12.4 % (ref 0.0–14.0)
NEUT#: 6.3 10*3/uL (ref 1.5–6.5)
NEUT%: 73 % (ref 38.4–76.8)
Platelets: 307 10*3/uL (ref 145–400)
RBC: 3.84 10*6/uL (ref 3.70–5.45)
WBC: 8.7 10*3/uL (ref 3.9–10.3)

## 2013-01-11 LAB — COMPREHENSIVE METABOLIC PANEL (CC13)
Albumin: 3.1 g/dL — ABNORMAL LOW (ref 3.5–5.0)
Alkaline Phosphatase: 101 U/L (ref 40–150)
Anion Gap: 9 mEq/L (ref 3–11)
BUN: 8.1 mg/dL (ref 7.0–26.0)
CO2: 24 mEq/L (ref 22–29)
Calcium: 10.5 mg/dL — ABNORMAL HIGH (ref 8.4–10.4)
Glucose: 100 mg/dl (ref 70–140)
Sodium: 139 mEq/L (ref 136–145)
Total Bilirubin: 0.52 mg/dL (ref 0.20–1.20)
Total Protein: 6.9 g/dL (ref 6.4–8.3)

## 2013-01-11 NOTE — Progress Notes (Addendum)
   [facility]    OFFICE PROGRESS NOTE   INTERVAL HISTORY:   She returns for scheduled followup of rectal cancer. She saw Dr. Michaell Cowing and is not a candidate for transanal debulking. She decided against an APR and diverting colostomy. She continues to have "pressure "at the rectum. She has irregular bowel habits with incontinence at times. The stool is soft.  Objective:  Vital signs in last 24 hours:  Blood pressure 159/64, pulse 82, temperature 97.9 F (36.6 C), temperature source Oral, resp. rate 18, height 5\' 5"  (1.651 m), weight 149 lb 8 oz (67.813 kg).   Resp: Lungs clear bilaterally Cardio: Regular rate and rhythm GI: No hepatomegaly, nontender, no mass Vascular: No leg edema Lymph nodes: No inguinal nodes    Medications: I have reviewed the patient's current medications.  Assessment/Plan: 1. Rectal cancer, clinical stage II (uT3, uN0).  Microsatellite stable, no loss of mismatch repair protein expression 2. Elevated CEA. 3. Rectal pressure/urgency secondary to #1. 4. Remote history of uterine cancer treated with surgery and adjuvant radiation.  Disposition:  She is not a candidate for transanal palliation and has decided against major surgery. The plan is to proceed with single agent Xeloda chemotherapy. Ms. Tep has attended a chemotherapy teaching class. We reviewed the potential toxicities associated with Xeloda including a chance for nausea, mucositis, diarrhea, and hematologic toxicity. We discussed the rash, hyperpigmentation, and hand/foot syndrome associated with Xeloda.  The plan is to begin a first cycle of Xeloda on 01/15/2013. She will take Xeloda on a 7 day on/7 day off schedule. She knows to discontinue Xeloda and contact us for diarrhea or hand/foot pain. She is scheduled for an office visit on 02/07/2013.  We obtained a baseline CEA today.   Thornton Papas, MD  01/11/2013  1:41 PM

## 2013-01-11 NOTE — Telephone Encounter (Signed)
gv and printed appt sched and avs for pt for Jan 2015...sent pt to lab per pof

## 2013-01-12 LAB — CEA: CEA: 17.4 ng/mL — ABNORMAL HIGH (ref 0.0–5.0)

## 2013-01-15 ENCOUNTER — Encounter: Payer: Self-pay | Admitting: Oncology

## 2013-01-15 NOTE — Progress Notes (Signed)
Per PAN patient approved for Xeloda 01/14/13-01/10/14  7500.00 I will send to billing and medical records.

## 2013-01-16 NOTE — Telephone Encounter (Signed)
RECEIVED A FAX FROM [facility] CONCERNING A CONFIRMATION OF PRESCRIPTION SHIPMENT FOR CAPECITABINE ON 01/15/13.

## 2013-02-06 ENCOUNTER — Other Ambulatory Visit: Payer: Self-pay | Admitting: *Deleted

## 2013-02-06 NOTE — Telephone Encounter (Signed)
THIS REFILL REQUEST FOR CAPECITABINE WAS GIVEN TO DR.SHERRILL'S NURSE, SUSAN COWARD,RN. 

## 2013-02-07 ENCOUNTER — Ambulatory Visit (HOSPITAL_BASED_OUTPATIENT_CLINIC_OR_DEPARTMENT_OTHER): Payer: Medicare (Managed Care) | Admitting: Nurse Practitioner

## 2013-02-07 ENCOUNTER — Other Ambulatory Visit (HOSPITAL_BASED_OUTPATIENT_CLINIC_OR_DEPARTMENT_OTHER): Payer: Medicare (Managed Care)

## 2013-02-07 ENCOUNTER — Other Ambulatory Visit: Payer: Self-pay | Admitting: *Deleted

## 2013-02-07 ENCOUNTER — Telehealth: Payer: Self-pay | Admitting: Oncology

## 2013-02-07 VITALS — BP 175/79 | HR 78 | Temp 97.0°F | Resp 18 | Ht 65.0 in | Wt 149.7 lb

## 2013-02-07 DIAGNOSIS — C20 Malignant neoplasm of rectum: Secondary | ICD-10-CM

## 2013-02-07 DIAGNOSIS — Z8542 Personal history of malignant neoplasm of other parts of uterus: Secondary | ICD-10-CM

## 2013-02-07 DIAGNOSIS — R970 Elevated carcinoembryonic antigen [CEA]: Secondary | ICD-10-CM

## 2013-02-07 LAB — COMPREHENSIVE METABOLIC PANEL (CC13)
ALBUMIN: 3.4 g/dL — AB (ref 3.5–5.0)
ALT: 12 U/L (ref 0–55)
AST: 20 U/L (ref 5–34)
Alkaline Phosphatase: 115 U/L (ref 40–150)
Anion Gap: 9 mEq/L (ref 3–11)
BUN: 8.8 mg/dL (ref 7.0–26.0)
CALCIUM: 9.8 mg/dL (ref 8.4–10.4)
CHLORIDE: 103 meq/L (ref 98–109)
CO2: 24 mEq/L (ref 22–29)
Creatinine: 0.8 mg/dL (ref 0.6–1.1)
GLUCOSE: 98 mg/dL (ref 70–140)
POTASSIUM: 3.8 meq/L (ref 3.5–5.1)
Sodium: 137 mEq/L (ref 136–145)
Total Bilirubin: 0.66 mg/dL (ref 0.20–1.20)
Total Protein: 7 g/dL (ref 6.4–8.3)

## 2013-02-07 LAB — CBC WITH DIFFERENTIAL/PLATELET
BASO%: 1.1 % (ref 0.0–2.0)
BASOS ABS: 0.1 10*3/uL (ref 0.0–0.1)
EOS%: 5.2 % (ref 0.0–7.0)
Eosinophils Absolute: 0.3 10*3/uL (ref 0.0–0.5)
HCT: 34.9 % (ref 34.8–46.6)
HEMOGLOBIN: 11.5 g/dL — AB (ref 11.6–15.9)
LYMPH#: 1.4 10*3/uL (ref 0.9–3.3)
LYMPH%: 24.1 % (ref 14.0–49.7)
MCH: 30.5 pg (ref 25.1–34.0)
MCHC: 32.8 g/dL (ref 31.5–36.0)
MCV: 93 fL (ref 79.5–101.0)
MONO#: 0.8 10*3/uL (ref 0.1–0.9)
MONO%: 13.2 % (ref 0.0–14.0)
NEUT#: 3.3 10*3/uL (ref 1.5–6.5)
NEUT%: 56.4 % (ref 38.4–76.8)
Platelets: 282 10*3/uL (ref 145–400)
RBC: 3.76 10*6/uL (ref 3.70–5.45)
RDW: 14.2 % (ref 11.2–14.5)
WBC: 5.9 10*3/uL (ref 3.9–10.3)

## 2013-02-07 MED ORDER — CAPECITABINE 500 MG PO TABS: ORAL_TABLET | ORAL | Status: DC

## 2013-02-07 NOTE — Progress Notes (Signed)
OFFICE PROGRESS NOTE  Interval history:  Ms. Huser returns as scheduled for followup of rectal cancer. She began a trial of Xeloda 1 week on/1 week off around 01/17/2013. She denies nausea/vomiting. No mouth sores. No hand or foot pain or redness. She has recently noted less pressure at the rectum and thinks stools are becoming formed. No recent rectal bleeding.   Objective: Filed Vitals:   02/07/13 1139  BP: 175/79  Pulse: 78  Temp: 97 F (36.1 C)  Resp: 18   No thrush or ulcerations. Lungs clear. Regular cardiac rhythm. Abdomen soft and nontender. No hepatomegaly. No leg edema. Palms without erythema. Left thumb with a small linear break in the skin.   Lab Results: Lab Results  Component Value Date   WBC 5.9 02/07/2013   HGB 11.5* 02/07/2013   HCT 34.9 02/07/2013   MCV 93.0 02/07/2013   PLT 282 02/07/2013   NEUTROABS 3.3 02/07/2013    Chemistry:    Chemistry      Component Value Date/Time   NA 137 02/07/2013 1120   NA 139 11/14/2012 1232   K 3.8 02/07/2013 1120   K 3.8 11/14/2012 1232   CL 105 11/14/2012 1232   CO2 24 02/07/2013 1120   CO2 26 11/14/2012 1232   BUN 8.8 02/07/2013 1120   BUN 13 11/14/2012 1232   CREATININE 0.8 02/07/2013 1120   CREATININE 0.7 11/14/2012 1232      Component Value Date/Time   CALCIUM 9.8 02/07/2013 1120   CALCIUM 9.4 11/14/2012 1232   ALKPHOS 115 02/07/2013 1120   ALKPHOS 82 11/14/2012 1232   AST 20 02/07/2013 1120   AST 16 11/14/2012 1232   ALT 12 02/07/2013 1120   ALT 12 11/14/2012 1232   BILITOT 0.66 02/07/2013 1120   BILITOT 0.6 11/14/2012 1232       Studies/Results: No results found.  Medications: I have reviewed the patient's current medications.  Assessment/Plan: 1. Rectal cancer, clinical stage II (uT3, uN0). Microsatellite stable, no loss of mismatch repair protein expression. Trial of Xeloda 1 week on/1 week off beginning around 01/17/2013. 2. Elevated CEA. 3. Rectal pressure/urgency secondary to #1. Question early  improvement. 4. Remote history of uterine cancer treated with surgery and adjuvant radiation.  Dispositon-she appears stable. Plan to continue Xeloda 1 week on/1 week off. She will return for a followup visit in approximately one month. She will contact the office in the interim with any problems. We will obtain a repeat CEA at time of her next visit.  Plan reviewed with Dr. Benay Spice.   Ned Card ANP/GNP-BC

## 2013-02-07 NOTE — Telephone Encounter (Signed)
GV AND PRINTED APPT SCHED AND AVS FOR PT FOR fEB

## 2013-02-07 NOTE — Telephone Encounter (Signed)
Faxed notification from [facility] that Xeloda script is in insurance verification process.

## 2013-02-09 ENCOUNTER — Telehealth: Payer: Self-pay | Admitting: Nutrition

## 2013-02-09 ENCOUNTER — Ambulatory Visit: Payer: Medicare (Managed Care) | Admitting: Nutrition

## 2013-02-09 NOTE — Telephone Encounter (Signed)
Received a request to contact patient for nutrition information.  I called patient and left a message with my contact information.

## 2013-02-09 NOTE — Progress Notes (Signed)
Patient's daughter returned telephone call.  Patient has been on a low fiber diet and patient's daughter had questions.  I provided clarification on appropriate foods on low fiber diet.  I will mail patient a copy of a low fiber diet that we use here at [facility] along with my contact information.  Patient's daughter was encouraged to call me if she has further questions.

## 2013-02-13 NOTE — Telephone Encounter (Signed)
error 

## 2013-02-14 NOTE — Telephone Encounter (Signed)
[facility] sent facsimile confirmation of Capecitabine prescription shipment.  Capecitabine was shipped on 02-12-2013 with next business day delivery.

## 2013-03-06 ENCOUNTER — Other Ambulatory Visit (HOSPITAL_BASED_OUTPATIENT_CLINIC_OR_DEPARTMENT_OTHER): Payer: Medicare (Managed Care)

## 2013-03-06 ENCOUNTER — Telehealth: Payer: Self-pay | Admitting: Oncology

## 2013-03-06 ENCOUNTER — Ambulatory Visit (HOSPITAL_BASED_OUTPATIENT_CLINIC_OR_DEPARTMENT_OTHER): Payer: Medicare (Managed Care) | Admitting: Oncology

## 2013-03-06 ENCOUNTER — Other Ambulatory Visit: Payer: Self-pay | Admitting: *Deleted

## 2013-03-06 VITALS — BP 161/59 | HR 90 | Temp 96.8°F | Resp 18 | Ht 65.0 in | Wt 148.1 lb

## 2013-03-06 DIAGNOSIS — Z8542 Personal history of malignant neoplasm of other parts of uterus: Secondary | ICD-10-CM

## 2013-03-06 DIAGNOSIS — R970 Elevated carcinoembryonic antigen [CEA]: Secondary | ICD-10-CM

## 2013-03-06 DIAGNOSIS — C20 Malignant neoplasm of rectum: Secondary | ICD-10-CM

## 2013-03-06 LAB — CBC WITH DIFFERENTIAL/PLATELET
BASO%: 0.8 % (ref 0.0–2.0)
Basophils Absolute: 0.1 10*3/uL (ref 0.0–0.1)
EOS%: 2.9 % (ref 0.0–7.0)
Eosinophils Absolute: 0.2 10*3/uL (ref 0.0–0.5)
HCT: 33.7 % — ABNORMAL LOW (ref 34.8–46.6)
HGB: 11.1 g/dL — ABNORMAL LOW (ref 11.6–15.9)
LYMPH%: 20.7 % (ref 14.0–49.7)
MCH: 31.7 pg (ref 25.1–34.0)
MCHC: 33 g/dL (ref 31.5–36.0)
MCV: 96.1 fL (ref 79.5–101.0)
MONO#: 1 10*3/uL — AB (ref 0.1–0.9)
MONO%: 15.7 % — AB (ref 0.0–14.0)
NEUT%: 59.9 % (ref 38.4–76.8)
NEUTROS ABS: 3.8 10*3/uL (ref 1.5–6.5)
Platelets: 236 10*3/uL (ref 145–400)
RBC: 3.5 10*6/uL — AB (ref 3.70–5.45)
RDW: 17.6 % — AB (ref 11.2–14.5)
WBC: 6.4 10*3/uL (ref 3.9–10.3)
lymph#: 1.3 10*3/uL (ref 0.9–3.3)

## 2013-03-06 LAB — COMPREHENSIVE METABOLIC PANEL (CC13)
ALK PHOS: 115 U/L (ref 40–150)
ALT: 12 U/L (ref 0–55)
AST: 19 U/L (ref 5–34)
Albumin: 3.5 g/dL (ref 3.5–5.0)
Anion Gap: 8 mEq/L (ref 3–11)
BUN: 14.2 mg/dL (ref 7.0–26.0)
CO2: 27 meq/L (ref 22–29)
Calcium: 10.1 mg/dL (ref 8.4–10.4)
Chloride: 104 mEq/L (ref 98–109)
Creatinine: 0.8 mg/dL (ref 0.6–1.1)
GLUCOSE: 96 mg/dL (ref 70–140)
POTASSIUM: 3.9 meq/L (ref 3.5–5.1)
SODIUM: 139 meq/L (ref 136–145)
Total Bilirubin: 0.84 mg/dL (ref 0.20–1.20)
Total Protein: 6.5 g/dL (ref 6.4–8.3)

## 2013-03-06 MED ORDER — CAPECITABINE 500 MG PO TABS: ORAL_TABLET | ORAL | Status: DC

## 2013-03-06 NOTE — Telephone Encounter (Signed)
, °

## 2013-03-06 NOTE — Progress Notes (Signed)
   Stephanie Burns    OFFICE PROGRESS NOTE   INTERVAL HISTORY:   She returns as scheduled. She continues Xeloda on a 7 day on/7 day off schedule. She completes another cycle today. No mouth sores, diarrhea, or consistent hand/foot pain. She had transient pain in the left foot earlier today. She has noticed significant improvement in the rectal pressure. The bowel movements are now formed.  Objective:  Vital signs in last 24 hours:  Blood pressure 161/59, pulse 90, temperature 96.8 F (36 C), temperature source Oral, resp. rate 18, height $RemoveBe'5\' 5"'QEKWNOdoA$  (1.651 m), weight 148 lb 1.6 oz (67.178 kg), SpO2 95.00%.    HEENT: No thrush or ulcers Resp: Lungs clear bilaterally Cardio: Regular rate and rhythm GI: No hepatomegaly, nontender Vascular: Trace to 1+ pitting edema at the low leg and ankle bilaterally  Skin: Mild erythema over the palms, mild erythema with dryness at the soles. No skin breakdown.    Lab Results:  Lab Results  Component Value Date   WBC 6.4 03/06/2013   HGB 11.1* 03/06/2013   HCT 33.7* 03/06/2013   MCV 96.1 03/06/2013   PLT 236 03/06/2013   NEUTROABS 3.8 03/06/2013      Medications: I have reviewed the patient's current medications.  Assessment/Plan: 1. Rectal cancer, clinical stage II (uT3, uN0). Microsatellite stable, no loss of mismatch repair protein expression.  Trial of Xeloda 1 week on/1 week off beginning around 01/17/2013. 2. Elevated CEA. 3. Rectal pressure/urgency secondary to #1. Question early improvement. 4. Remote history of uterine cancer treated with surgery and adjuvant radiation.  Disposition:  Stephanie Burns appears to be tolerating the Xeloda well. There has been clinical improvement. The plan is to continue Xeloda on a 7 day on/7 day off schedule beginning with the next cycle on 03/14/2013. We will followup on the CEA from today. She will return for an office visit and rectal exam in one month.   [name], MD  03/06/2013  12:51  PM

## 2013-03-07 ENCOUNTER — Telehealth: Payer: Self-pay | Admitting: *Deleted

## 2013-03-07 LAB — CEA: CEA: 3.3 ng/mL (ref 0.0–5.0)

## 2013-03-07 NOTE — Telephone Encounter (Signed)
Notified daughter of CEA results.

## 2013-03-07 NOTE — Telephone Encounter (Signed)
Message copied by Tania Ade on Wed Mar 07, 2013  3:56 PM ------      Message from: [name] B      Created: Wed Mar 07, 2013  3:34 PM       Please call patient, cea is better ------

## 2013-03-13 NOTE — Telephone Encounter (Signed)
RECEIVED A FAX FROM [facility] CONCERNING A CONFIRMATION OF PRESCRIPTION SHIPMENT FOR CAPECITABINE ON 03/12/13.

## 2013-04-04 ENCOUNTER — Telehealth: Payer: Self-pay | Admitting: *Deleted

## 2013-04-04 ENCOUNTER — Ambulatory Visit (HOSPITAL_BASED_OUTPATIENT_CLINIC_OR_DEPARTMENT_OTHER): Payer: Medicare (Managed Care) | Admitting: Oncology

## 2013-04-04 ENCOUNTER — Other Ambulatory Visit: Payer: Self-pay | Admitting: Oncology

## 2013-04-04 ENCOUNTER — Other Ambulatory Visit (HOSPITAL_BASED_OUTPATIENT_CLINIC_OR_DEPARTMENT_OTHER): Payer: Medicare (Managed Care)

## 2013-04-04 VITALS — BP 167/64 | HR 72 | Temp 97.3°F | Resp 18 | Ht 65.0 in | Wt 149.5 lb

## 2013-04-04 DIAGNOSIS — K625 Hemorrhage of anus and rectum: Secondary | ICD-10-CM

## 2013-04-04 DIAGNOSIS — K6289 Other specified diseases of anus and rectum: Secondary | ICD-10-CM

## 2013-04-04 DIAGNOSIS — C20 Malignant neoplasm of rectum: Secondary | ICD-10-CM

## 2013-04-04 DIAGNOSIS — Z8542 Personal history of malignant neoplasm of other parts of uterus: Secondary | ICD-10-CM

## 2013-04-04 LAB — CBC WITH DIFFERENTIAL/PLATELET
BASO%: 1 % (ref 0.0–2.0)
Basophils Absolute: 0.1 10*3/uL (ref 0.0–0.1)
EOS%: 4.5 % (ref 0.0–7.0)
Eosinophils Absolute: 0.2 10*3/uL (ref 0.0–0.5)
HEMATOCRIT: 32.6 % — AB (ref 34.8–46.6)
HGB: 10.8 g/dL — ABNORMAL LOW (ref 11.6–15.9)
LYMPH#: 1.3 10*3/uL (ref 0.9–3.3)
LYMPH%: 24.5 % (ref 14.0–49.7)
MCH: 33.5 pg (ref 25.1–34.0)
MCHC: 33.2 g/dL (ref 31.5–36.0)
MCV: 100.7 fL (ref 79.5–101.0)
MONO#: 0.8 10*3/uL (ref 0.1–0.9)
MONO%: 15 % — ABNORMAL HIGH (ref 0.0–14.0)
NEUT%: 55 % (ref 38.4–76.8)
NEUTROS ABS: 2.9 10*3/uL (ref 1.5–6.5)
Platelets: 233 10*3/uL (ref 145–400)
RBC: 3.24 10*6/uL — ABNORMAL LOW (ref 3.70–5.45)
RDW: 22 % — ABNORMAL HIGH (ref 11.2–14.5)
WBC: 5.3 10*3/uL (ref 3.9–10.3)

## 2013-04-04 NOTE — Telephone Encounter (Signed)
appts made and printed...td 

## 2013-04-04 NOTE — Telephone Encounter (Signed)
Confirmed with [facility], Xeloda refill is scheduled for delivery on 3/6.

## 2013-04-04 NOTE — Progress Notes (Signed)
[place]    OFFICE PROGRESS NOTE   INTERVAL HISTORY:   She completed another cycle of Xeloda yesterday. No mouth sores, nausea, diarrhea, or hand/foot pain. Minimal rectal bleeding. The rectal pressure has improved. She reports bilateral leg edema.  Objective:  Vital signs in last 24 hours:  Blood pressure 167/64, pulse 72, temperature 97.3 F (36.3 C), temperature source Oral, resp. rate 18, height 5' 5" (1.651 m), weight 149 lb 8 oz (67.813 kg), SpO2 100.00%.    HEENT: No thrush or ulcers Resp: Lungs clear bilaterally Cardio: Regular rate and rhythm, 2/6 systolic murmur GI: No hepatomegaly, nontender. Rectal: Diminished tone, nodular ulceration at the anterior rectum a few centimeters from the anal verge. I could not palpate the rectal lumen above the tumor. There was blood on the examination glove and she passed a few small clots after the exam. Vascular: Trace to 1+ edema at the left greater than right lower leg  Skin: Palms without erythema. Mild erythema with callus formation over the soles    Lab Results:  Lab Results  Component Value Date   WBC 5.3 04/04/2013   HGB 10.8* 04/04/2013   HCT 32.6* 04/04/2013   MCV 100.7 04/04/2013   PLT 233 04/04/2013   NEUTROABS 2.9 04/04/2013   03/06/2013-CEA 3.3   Medications: I have reviewed the patient's current medications.  Assessment/Plan: 1. Rectal cancer, clinical stage II (uT3, uN0). Microsatellite stable, no loss of mismatch repair protein expression.  Trial of Xeloda 1 week on/1 week off beginning around 01/17/2013. CEA improved to 3 2015 2. Rectal pressure/urgency secondary to #1. Improved. 3. Remote history of uterine cancer treated with surgery and adjuvant radiation.  Disposition:  The rectal mass appears smaller on exam today. The CEA is lower and her symptoms have improved. She had bleeding with a rectal exam today. She will contact us for recurrent bleeding. The plan is to continue Xeloda on a 7  day on/7 day off schedule. She will return for an office visit on 05/01/2013. I have a low clinical suspicion for a deep vein thrombosis. She will contact us for leg erythema or pain.   [name], MD  04/04/2013  11:22 AM

## 2013-05-01 ENCOUNTER — Telehealth: Payer: Self-pay | Admitting: Oncology

## 2013-05-01 ENCOUNTER — Other Ambulatory Visit (HOSPITAL_BASED_OUTPATIENT_CLINIC_OR_DEPARTMENT_OTHER): Payer: Medicare (Managed Care)

## 2013-05-01 ENCOUNTER — Ambulatory Visit (HOSPITAL_BASED_OUTPATIENT_CLINIC_OR_DEPARTMENT_OTHER): Payer: Medicare (Managed Care) | Admitting: Nurse Practitioner

## 2013-05-01 VITALS — BP 164/56 | HR 65 | Temp 97.0°F | Resp 17 | Ht 65.0 in | Wt 149.6 lb

## 2013-05-01 DIAGNOSIS — C20 Malignant neoplasm of rectum: Secondary | ICD-10-CM

## 2013-05-01 DIAGNOSIS — Z8542 Personal history of malignant neoplasm of other parts of uterus: Secondary | ICD-10-CM

## 2013-05-01 LAB — CBC WITH DIFFERENTIAL/PLATELET
BASO%: 1 % (ref 0.0–2.0)
BASOS ABS: 0.1 10*3/uL (ref 0.0–0.1)
EOS%: 3.8 % (ref 0.0–7.0)
Eosinophils Absolute: 0.2 10*3/uL (ref 0.0–0.5)
HEMATOCRIT: 32.1 % — AB (ref 34.8–46.6)
HEMOGLOBIN: 10.6 g/dL — AB (ref 11.6–15.9)
LYMPH#: 1.1 10*3/uL (ref 0.9–3.3)
LYMPH%: 20.4 % (ref 14.0–49.7)
MCH: 34.2 pg — ABNORMAL HIGH (ref 25.1–34.0)
MCHC: 32.9 g/dL (ref 31.5–36.0)
MCV: 103.7 fL — ABNORMAL HIGH (ref 79.5–101.0)
MONO#: 0.9 10*3/uL (ref 0.1–0.9)
MONO%: 17.4 % — ABNORMAL HIGH (ref 0.0–14.0)
NEUT#: 3.1 10*3/uL (ref 1.5–6.5)
NEUT%: 57.4 % (ref 38.4–76.8)
Platelets: 209 10*3/uL (ref 145–400)
RBC: 3.1 10*6/uL — ABNORMAL LOW (ref 3.70–5.45)
RDW: 21 % — ABNORMAL HIGH (ref 11.2–14.5)
WBC: 5.4 10*3/uL (ref 3.9–10.3)

## 2013-05-01 MED ORDER — CAPECITABINE 500 MG PO TABS
ORAL_TABLET | ORAL | Status: DC
Start: 2013-05-09 — End: 2013-06-01

## 2013-05-01 NOTE — Telephone Encounter (Signed)
[facility] PT APPT SCHEDULE FOR APRIL °

## 2013-05-01 NOTE — Progress Notes (Addendum)
  [place] OFFICE PROGRESS NOTE   Diagnosis:  Rectal cancer.  INTERVAL HISTORY:   She returns for scheduled followup. She continues Xeloda 1 week on/1 week off. She denies nausea/vomiting. No mouth sores. No diarrhea. No hand or foot pain or redness. She continues to note a callus on the left foot. She denies rectal bleeding. The rectal pressure has essentially resolved. She continues to have frequent bowel movements. The consistency varies from pudding-like to formed.  Objective:  Vital signs in last 24 hours:  Blood pressure 164/56, pulse 65, temperature 97 F (36.1 C), temperature source Oral, resp. rate 17, height $RemoveBe'5\' 5"'HQJUwaZvy$  (1.651 m), weight 149 lb 9.6 oz (67.858 kg), SpO2 100.00%.    HEENT: No thrush or ulcerations. Resp: Lungs clear. Cardio: Regular cardiac rhythm. 2/6 systolic murmur. GI: Abdomen soft and nontender. No hepatomegaly. Vascular: Trace lower leg edema bilaterally left greater than right. Calves soft and nontender.  Skin: Palms nontender and without erythema. Soles erythematous, nontender. No skin breakdown. Callus formation left upper medial foot.   Lab Results:  Lab Results  Component Value Date   WBC 5.4 05/01/2013   HGB 10.6* 05/01/2013   HCT 32.1* 05/01/2013   MCV 103.7* 05/01/2013   PLT 209 05/01/2013   NEUTROABS 3.1 05/01/2013    Lab Results  Component Value Date   CEA 3.3 03/06/2013    Imaging:  No results found.  Medications: I have reviewed the patient's current medications.  Assessment/Plan: 1. Rectal cancer, clinical stage II (uT3, uN0). Microsatellite stable, no loss of mismatch repair protein expression.  Trial of Xeloda 1 week on/1 week off beginning around 01/17/2013.  CEA improved 03/06/2013. 2. Rectal pressure/urgency secondary to #1. Improved. 3. Remote history of uterine cancer treated with surgery and adjuvant radiation. 4. Early hand-foot syndrome manifesting with erythema over the soles.   Disposition: She  appears stable. Presenting symptoms of rectal pressure and bleeding are significantly better. The CEA has normalized. Plan to continue Xeloda 7 days on/7 days off. She will return for a followup visit in one month. She will contact the office if she develops progressive hand/foot symptoms.   Patient seen with Dr. Benay Spice.    Ned Card ANP/GNP-BC   05/01/2013  11:48 AM This was a shared visit with Ned Card.  The plan is to continue Xeloda on the current schedule.She will return for an office visit 05/28/2013. She plans to relocate to [place] for the summer.  Julieanne Manson, M.D.

## 2013-05-02 LAB — CEA: CEA: 4.5 ng/mL (ref 0.0–5.0)

## 2013-05-07 ENCOUNTER — Telehealth: Payer: Self-pay | Admitting: *Deleted

## 2013-05-07 NOTE — Telephone Encounter (Signed)
Spoke with pt and was informed that pt is now resides in [place].  Pt would like to her records to be sent to : Dr.  Griffith Citron Fax      [phone]. Phone    [phone]. Pt stated after Dr. Toney Rakes received all records, pt will be contacted with an appt. Message given to  [place] in medical records. Pt's  Phone   [phone].

## 2013-05-07 NOTE — Telephone Encounter (Signed)
Fax received from [facility] that prescription of Capecitabine shipped to patient on 05/04/13.  Fax given to Dr. Gearldine Shown nurse.

## 2013-05-10 ENCOUNTER — Telehealth: Payer: Self-pay | Admitting: Oncology

## 2013-05-10 NOTE — Telephone Encounter (Signed)
Sent medical records to [place] office.

## 2013-05-28 ENCOUNTER — Ambulatory Visit (HOSPITAL_BASED_OUTPATIENT_CLINIC_OR_DEPARTMENT_OTHER): Payer: Medicare (Managed Care) | Admitting: Oncology

## 2013-05-28 ENCOUNTER — Other Ambulatory Visit (HOSPITAL_BASED_OUTPATIENT_CLINIC_OR_DEPARTMENT_OTHER): Payer: Medicare (Managed Care)

## 2013-05-28 ENCOUNTER — Telehealth: Payer: Self-pay | Admitting: Oncology

## 2013-05-28 VITALS — BP 172/58 | HR 71 | Temp 97.5°F | Resp 17 | Ht 65.0 in | Wt 150.3 lb

## 2013-05-28 DIAGNOSIS — R198 Other specified symptoms and signs involving the digestive system and abdomen: Secondary | ICD-10-CM

## 2013-05-28 DIAGNOSIS — L270 Generalized skin eruption due to drugs and medicaments taken internally: Secondary | ICD-10-CM

## 2013-05-28 DIAGNOSIS — C20 Malignant neoplasm of rectum: Secondary | ICD-10-CM

## 2013-05-28 DIAGNOSIS — Z8542 Personal history of malignant neoplasm of other parts of uterus: Secondary | ICD-10-CM

## 2013-05-28 LAB — CBC WITH DIFFERENTIAL/PLATELET
BASO%: 0.6 % (ref 0.0–2.0)
Basophils Absolute: 0 10*3/uL (ref 0.0–0.1)
EOS%: 4.2 % (ref 0.0–7.0)
Eosinophils Absolute: 0.2 10*3/uL (ref 0.0–0.5)
HEMATOCRIT: 33.5 % — AB (ref 34.8–46.6)
HGB: 11 g/dL — ABNORMAL LOW (ref 11.6–15.9)
LYMPH%: 21 % (ref 14.0–49.7)
MCH: 33.8 pg (ref 25.1–34.0)
MCHC: 32.8 g/dL (ref 31.5–36.0)
MCV: 103.1 fL — ABNORMAL HIGH (ref 79.5–101.0)
MONO#: 0.8 10*3/uL (ref 0.1–0.9)
MONO%: 15.2 % — AB (ref 0.0–14.0)
NEUT#: 3.1 10*3/uL (ref 1.5–6.5)
NEUT%: 59 % (ref 38.4–76.8)
Platelets: 184 10*3/uL (ref 145–400)
RBC: 3.25 10*6/uL — ABNORMAL LOW (ref 3.70–5.45)
RDW: 17.8 % — ABNORMAL HIGH (ref 11.2–14.5)
WBC: 5.2 10*3/uL (ref 3.9–10.3)
lymph#: 1.1 10*3/uL (ref 0.9–3.3)

## 2013-05-28 LAB — COMPREHENSIVE METABOLIC PANEL (CC13)
ALK PHOS: 105 U/L (ref 40–150)
ALT: 11 U/L (ref 0–55)
AST: 23 U/L (ref 5–34)
Albumin: 3.4 g/dL — ABNORMAL LOW (ref 3.5–5.0)
Anion Gap: 7 mEq/L (ref 3–11)
BUN: 12.2 mg/dL (ref 7.0–26.0)
CO2: 25 mEq/L (ref 22–29)
CREATININE: 0.8 mg/dL (ref 0.6–1.1)
Calcium: 9.8 mg/dL (ref 8.4–10.4)
Chloride: 109 mEq/L (ref 98–109)
Glucose: 108 mg/dl (ref 70–140)
Potassium: 3.8 mEq/L (ref 3.5–5.1)
Sodium: 141 mEq/L (ref 136–145)
Total Bilirubin: 0.93 mg/dL (ref 0.20–1.20)
Total Protein: 6.3 g/dL — ABNORMAL LOW (ref 6.4–8.3)

## 2013-05-28 NOTE — Telephone Encounter (Signed)
gv adn printed appt sched and avs for pt for OCT °

## 2013-05-28 NOTE — Progress Notes (Signed)
  Stephanie Burns   Diagnosis: Rectal cancer  INTERVAL HISTORY:   Stephanie Burns returns as scheduled. She will complete the current week of Xeloda 05/29/2013. She denies mouth sores, diarrhea, and hand/foot pain. No rectal pain or bleeding. The stool is soft and she has frequent bowel movements. She plans to relocate to [place] later this week.  Objective:  Vital signs in last 24 hours:  Blood pressure 172/58, pulse 71, temperature 97.5 F (36.4 C), temperature source Oral, resp. rate 17, height $RemoveBe'5\' 5"'sPEnMdIDx$  (1.651 m), weight 150 lb 4.8 oz (68.176 kg), SpO2 100.00%.    HEENT: No thrush, 2 mm healing ulceration at the right buccal mucosa Resp: Lungs clear bilaterally Cardio: Regular rate and rhythm GI: No hepatomegaly, nontender Vascular: 1+ pitting edema at the left greater than right lower leg and ankle with mild erythema at the left low pretibial area  Skin: Mild erythema over the palms and soles. A few areas of callus formation over the soles Rectal: Light brown stool, firm nodularity at the anterior rectal wall beginning a few centimeters proximal to the anal verge. I cannot palpate the rectal lumen above the area of nodularity. No significant luminal mass    Lab Results:  Lab Results  Component Value Date   WBC 5.2 05/28/2013   HGB 11.0* 05/28/2013   HCT 33.5* 05/28/2013   MCV 103.1* 05/28/2013   PLT 184 05/28/2013   NEUTROABS 3.1 05/28/2013    Lab Results  Component Value Date   CEA 4.5 05/01/2013    Imaging:  No results found.  Medications: I have reviewed the patient's current medications.  Assessment/Plan: 1. Rectal cancer, clinical stage II (uT3, uN0). Microsatellite stable, no loss of mismatch repair protein expression.  Trial of Xeloda 1 week on/1 week off beginning around 01/17/2013.  CEA improved 03/06/2013. 2. Rectal pressure/urgency secondary to #1. Improved. 3. Remote history of uterine cancer treated with surgery and adjuvant  radiation. 4. Early hand-foot syndrome manifesting with erythema over the palms and soles.    Disposition:  She appears stable. She is been maintained on Xeloda for the past 4 months. There has been improvement in pelvic pressure/diarrhea and the CEA is lower. I discussed treatment options with Stephanie Burns and her daughter. I recommend continuing Xeloda as long as she is tolerating it and there is no evidence of disease progression. She could benefit from a repeat sigmoidoscopy within the next few months.  I explained there is no "standard" length of Xeloda therapy in this setting.  She plans to relocate to [place] within the next few days. She has scheduled an appointment with Dr. Toney Rakes on 06/12/2013. I will contact Dr. Toney Rakes to discuss the case.  She will be scheduled for a followup appointment here in October of 2015.  The rectal cancer is felt to most likely be related to previous pelvic radiation. She was seen by the surgeons here and was not felt to be a candidate for a sphincter sparing surgery. She is not a candidate for additional radiation. She does not wish to have a colostomy.  Stephanie Pier, MD  05/28/2013  12:14 PM

## 2013-05-29 LAB — CEA: CEA: 6.1 ng/mL — ABNORMAL HIGH (ref 0.0–5.0)

## 2013-05-31 ENCOUNTER — Telehealth: Payer: Self-pay | Admitting: *Deleted

## 2013-05-31 NOTE — Telephone Encounter (Signed)
Left message on home voicemail requesting pt call with new address for Xeloda Rx.

## 2013-06-01 ENCOUNTER — Telehealth: Payer: Self-pay | Admitting: *Deleted

## 2013-06-01 ENCOUNTER — Telehealth: Payer: Self-pay

## 2013-06-01 DIAGNOSIS — C20 Malignant neoplasm of rectum: Secondary | ICD-10-CM

## 2013-06-01 MED ORDER — CAPECITABINE 500 MG PO TABS: ORAL_TABLET | ORAL | Status: DC

## 2013-06-01 NOTE — Telephone Encounter (Signed)
[facility] has new shipping address per customer service.

## 2013-06-01 NOTE — Telephone Encounter (Signed)
Called to give new address of patient : [address], [place] [address]. Address needed to send a prescription.

## 2013-06-04 NOTE — Telephone Encounter (Signed)
RECEIVED A FAX FROM [facility] CONCERNING A CONFIRMATION OF FACSIMILE RECEIPT FOR PT. REFERRALL.

## 2013-06-05 NOTE — Telephone Encounter (Signed)
RECEIVED A FAX FROM [facility] CONCERNING A CONFIRMATION OF PRESCRIPTION SHIPMENT FOR CAPECITABINE ON 06/04/13.

## 2013-09-04 ENCOUNTER — Encounter: Payer: Self-pay | Admitting: Gastroenterology

## 2013-09-28 ENCOUNTER — Encounter: Payer: Self-pay | Admitting: Oncology

## 2013-09-28 NOTE — Progress Notes (Signed)
Per Tammy at [facility] approved for 01/11/14-01/11/15. I will send to billing.

## 2013-11-15 ENCOUNTER — Telehealth: Payer: Self-pay | Admitting: Oncology

## 2013-11-15 ENCOUNTER — Ambulatory Visit (HOSPITAL_BASED_OUTPATIENT_CLINIC_OR_DEPARTMENT_OTHER): Payer: Medicare (Managed Care) | Admitting: Oncology

## 2013-11-15 ENCOUNTER — Encounter: Payer: Self-pay | Admitting: Oncology

## 2013-11-15 VITALS — BP 181/65 | HR 68 | Temp 97.7°F | Resp 17 | Ht 65.0 in | Wt 143.6 lb

## 2013-11-15 DIAGNOSIS — Z8542 Personal history of malignant neoplasm of other parts of uterus: Secondary | ICD-10-CM

## 2013-11-15 DIAGNOSIS — C20 Malignant neoplasm of rectum: Secondary | ICD-10-CM

## 2013-11-15 NOTE — Progress Notes (Signed)
  [facility] OFFICE PROGRESS NOTE   Diagnosis: Rectal cancer  INTERVAL HISTORY:   Stephanie Burns was last seen here in April of 2015. She was maintained on Xeloda at the time. She relocated to [place] for the summer and was seen by Dr. Toney Rakes. Plans were to continue Xeloda, but she developed obstructive symptoms in May of 2013 and underwent a diverting colostomy in [place] (we do not have these records available today). She remains off of specific therapy for rectal cancer. She was evaluated by radiation oncology and radiation was not recommended.  She reports feeling well. No pain. Intermittent rectal drainage and bleeding. Intermittent rectal urgency. The colostomy is functioning well.  Objective:  Vital signs in last 24 hours:  Blood pressure 181/65, pulse 68, temperature 97.7 F (36.5 C), temperature source Oral, resp. rate 17, height $RemoveBe'5\' 5"'rSBPCdDZb$  (1.651 m), weight 143 lb 9.6 oz (65.137 kg).    HEENT: Neck without mass Lymphatics: No cervical, supra-clavicular, axillary, or inguinal nodes Resp: Lungs clear bilaterally Cardio: Regular rate and rhythm GI: No hepatomegaly, left lower quadrant colostomy, nontender, no mass Vascular: No leg edema    Medications: I have reviewed the patient's current medications.  Assessment/Plan: 1. Rectal cancer, clinical stage II (uT3, uN0). Microsatellite stable, no loss of mismatch repair protein expression.  Trial of Xeloda 1 week on/1 week off beginning around 01/17/2013.  CEA improved 03/06/2013. Xeloda continued until approximately May of 2015 when she developed a bowel obstruction requiring a diverting colostomy 2. Rectal pressure/urgency secondary to #1. Improved. 3. Remote history of uterine cancer treated with surgery and adjuvant radiation. 4. History of Early hand-foot syndrome manifesting with erythema over the palms and soles.  Disposition:  Stephanie Burns has returned to [facility]. She developed a bowel  obstruction while in [place] and underwent a diverting colostomy. Xeloda has been discontinued. She has minimal symptoms related to the rectal tumor at present. I discussed a trial of salvage systemic chemotherapy with Stephanie Burns and her daughter. She does not wish to consider additional chemotherapy at present.  She will return for an office visit in 2 months. Stephanie Burns will contact us in the interim for new symptoms.  [name], MD  11/15/2013  1:46 PM

## 2013-11-15 NOTE — Patient Instructions (Signed)
[facility] Discharge Instructions  RECOMMENDATIONS MADE BY THE CONSULTANT AND ANY TEST RESULTS WILL BE SENT TO YOUR REFERRING PHYSICIAN.  Please provide office a copy of your Advanced Directive when available to scanned into your record.                        Thank you for choosing [facility] to provide your oncology and hematology care.  To afford each patient quality time with our providers, please arrive at least 30 minutes before your scheduled appointment time.  With your help, our goal is to use those 30 minutes to complete the necessary work-up to ensure our physicians have the information they need to help with your evaluation and healthcare recommendations.   ___________________  Should you have questions after your visit to [facility], please contact our office at [phone] between the hours of 8:30 a.m. and 4:30 p.m.  Voicemails left after 4:00 p.m. will not be returned until the following business day.  For prescription refill requests, have your pharmacy contact our office with your prescription refill request. We request 24 hour notice for all refill requests.     Fall Prevention and Home Safety Falls cause injuries and can affect all age groups. It is possible to use preventive measures to significantly decrease the likelihood of falls. There are many simple measures which can make your home safer and prevent falls. OUTDOORS  Repair cracks and edges of walkways and driveways.  Remove high doorway thresholds.  Trim shrubbery on the main path into your home.  Have good outside lighting.  Clear walkways of tools, rocks, debris, and clutter.  Check that handrails are not broken and are securely fastened. Both sides of steps should have handrails.  Have leaves, snow, and ice cleared regularly.  Use sand or salt on walkways during winter months.  In the garage, clean up grease or oil spills. BATHROOM  Install night  lights.  Install grab bars by the toilet and in the tub and shower.  Use non-skid mats or decals in the tub or shower.  Place a plastic non-slip stool in the shower to sit on, if needed.  Keep floors dry and clean up all water on the floor immediately.  Remove soap buildup in the tub or shower on a regular basis.  Secure bath mats with non-slip, double-sided rug tape.  Remove throw rugs and tripping hazards from the floors. BEDROOMS  Install night lights.  Make sure a bedside light is easy to reach.  Do not use oversized bedding.  Keep a telephone by your bedside.  Have a firm chair with side arms to use for getting dressed.  Remove throw rugs and tripping hazards from the floor. KITCHEN  Keep handles on pots and pans turned toward the center of the stove. Use back burners when possible.  Clean up spills quickly and allow time for drying.  Avoid walking on wet floors.  Avoid hot utensils and knives.  Position shelves so they are not too high or low.  Place commonly used objects within easy reach.  If necessary, use a sturdy step stool with a grab bar when reaching.  Keep electrical cables out of the way.  Do not use floor polish or wax that makes floors slippery. If you must use wax, use non-skid floor wax.  Remove throw rugs and tripping hazards from the floor. STAIRWAYS  Never leave objects on stairs.  Place handrails  on both sides of stairways and use them. Fix any loose handrails. Make sure handrails on both sides of the stairways are as long as the stairs.  Check carpeting to make sure it is firmly attached along stairs. Make repairs to worn or loose carpet promptly.  Avoid placing throw rugs at the top or bottom of stairways, or properly secure the rug with carpet tape to prevent slippage. Get rid of throw rugs, if possible.  Have an electrician put in a light switch at the top and bottom of the stairs. OTHER FALL PREVENTION TIPS  Wear low-heel or  rubber-soled shoes that are supportive and fit well. Wear closed toe shoes.  When using a stepladder, make sure it is fully opened and both spreaders are firmly locked. Do not climb a closed stepladder.  Add color or contrast paint or tape to grab bars and handrails in your home. Place contrasting color strips on first and last steps.  Learn and use mobility aids as needed. Install an electrical emergency response system.  Turn on lights to avoid dark areas. Replace light bulbs that burn out immediately. Get light switches that glow.  Arrange furniture to create clear pathways. Keep furniture in the same place.  Firmly attach carpet with non-skid or double-sided tape.  Eliminate uneven floor surfaces.  Select a carpet pattern that does not visually hide the edge of steps.  Be aware of all pets. OTHER HOME SAFETY TIPS  Set the water temperature for 120 F (48.8 C).  Keep emergency numbers on or near the telephone.  Keep smoke detectors on every level of the home and near sleeping areas. Document Released: 01/08/2002 Document Revised: 07/20/2011 Document Reviewed: 04/09/2011 [facility] Patient Information 2015 [place], [place]. This information is not intended to replace advice given to you by your health care provider. Make sure you discuss any questions you have with your health care provider.

## 2013-11-15 NOTE — Telephone Encounter (Signed)
gv adn printed appt sched and avs for pt for Dec °

## 2013-11-21 ENCOUNTER — Telehealth: Payer: Self-pay | Admitting: *Deleted

## 2013-11-21 NOTE — Telephone Encounter (Signed)
Call from pt's daughter asking if there is an ostomy nurse available to her as issues arise with colostomy. Instructed her to call this office with issues. Also gave # to [facility] in case pt has questions about supplies etc. Daughter reports pt has "red bumps" around stoma that seem to be healing. Understands to call for worsening irritation around stoma.

## 2014-01-02 ENCOUNTER — Telehealth: Payer: Self-pay | Admitting: Oncology

## 2014-01-02 NOTE — Telephone Encounter (Signed)
S/w pt's daughter adised of appt chg from 12/15 to 12/24 @ 10am. Pt's daughter verbalized understanding.

## 2014-01-15 ENCOUNTER — Ambulatory Visit: Payer: Medicare (Managed Care) | Admitting: Oncology

## 2014-01-15 ENCOUNTER — Other Ambulatory Visit: Payer: Medicare (Managed Care)

## 2014-01-23 ENCOUNTER — Other Ambulatory Visit: Payer: Self-pay | Admitting: *Deleted

## 2014-01-23 DIAGNOSIS — C20 Malignant neoplasm of rectum: Secondary | ICD-10-CM

## 2014-01-23 NOTE — Addendum Note (Signed)
Addended by: Brien Few on: 01/23/2014 04:55 PM   Modules accepted: Orders

## 2014-01-24 ENCOUNTER — Telehealth: Payer: Self-pay | Admitting: Oncology

## 2014-01-24 ENCOUNTER — Ambulatory Visit (HOSPITAL_BASED_OUTPATIENT_CLINIC_OR_DEPARTMENT_OTHER): Payer: Medicare (Managed Care) | Admitting: Oncology

## 2014-01-24 ENCOUNTER — Other Ambulatory Visit (HOSPITAL_BASED_OUTPATIENT_CLINIC_OR_DEPARTMENT_OTHER): Payer: Medicare (Managed Care)

## 2014-01-24 VITALS — BP 174/64 | HR 59 | Temp 97.5°F | Resp 18 | Ht 65.0 in | Wt 146.6 lb

## 2014-01-24 DIAGNOSIS — L271 Localized skin eruption due to drugs and medicaments taken internally: Secondary | ICD-10-CM

## 2014-01-24 DIAGNOSIS — C20 Malignant neoplasm of rectum: Secondary | ICD-10-CM

## 2014-01-24 DIAGNOSIS — Z8542 Personal history of malignant neoplasm of other parts of uterus: Secondary | ICD-10-CM

## 2014-01-24 LAB — CEA: CEA: 2.6 ng/mL (ref 0.0–5.0)

## 2014-01-24 NOTE — Progress Notes (Signed)
  [place] OFFICE PROGRESS NOTE   Diagnosis: Rectal cancer  INTERVAL HISTORY:   She returns as scheduled. She complains of hair loss. Good appetite. The colostomy is functioning well. She has a rectal discharge and some stool. Occasional rectal bleeding. No pain. The feet are discolored.  Objective:  Vital signs in last 24 hours:  Blood pressure 174/64, pulse 59, temperature 97.5 F (36.4 C), temperature source Oral, resp. rate 18, height $RemoveBe'5\' 5"'CJPiFltyQ$  (1.651 m), weight 146 lb 9.6 oz (66.497 kg), SpO2 100 %.  Resp: Lungs clear bilaterally Cardio: Regular rate and rhythm, 2/6 systolic murmur GI: No hepatomegaly, nontender, left lower quadrant colostomy Vascular: No leg edema, the femoral pulses are palpable  Skin: Mild erythema at the soles and palms bilaterally. No skin breakdown.   Medications: I have reviewed the patient's current medications.  Assessment/Plan: 1. Rectal cancer, clinical stage II (uT3, uN0).  Microsatellite stable, no loss of mismatch repair protein expression.   Trial of Xeloda 1 week on/1 week off beginning around 01/17/2013.   CEA improved 03/06/2013.  Xeloda continued until approximately May of 2015 when she developed a bowel obstruction requiring a diverting colostomy 2. Rectal pressure/urgency secondary to #1. Improved. 3. Remote history of uterine cancer treated with surgery and adjuvant radiation. 4. History of Early hand-foot syndrome manifesting with erythema over the palms and soles.    Disposition:  Ms. Appelbaum appears stable. The discoloration at the palms and soles is most likely related to Raynauds or vascular disease. I suspect the small amount of stool per rectum is related to a "loop "colostomy.  She maintains a good performance status. The plan is to follow her with observation. She will contact us for increased rectal bleeding or new symptoms. She will return for an office visit in 3 months.  [name],  MD  01/24/2014  10:18 AM

## 2014-01-28 ENCOUNTER — Telehealth: Payer: Self-pay | Admitting: *Deleted

## 2014-01-28 NOTE — Telephone Encounter (Signed)
Per Dr. Benay Spice; notified pt's daughter that cea is normal.  Pt's daughter verbalized understanding and confirmed appt for 05/01/13.

## 2014-01-28 NOTE — Telephone Encounter (Signed)
-----   Message from Ladell Pier, MD sent at 01/24/2014  3:35 PM EST ----- Please call patient, Stephanie Burns is normal

## 2014-04-03 ENCOUNTER — Encounter: Payer: Self-pay | Admitting: Gastroenterology

## 2014-05-02 ENCOUNTER — Telehealth: Payer: Self-pay | Admitting: Oncology

## 2014-05-02 ENCOUNTER — Ambulatory Visit (HOSPITAL_BASED_OUTPATIENT_CLINIC_OR_DEPARTMENT_OTHER): Payer: Medicare Other | Admitting: Oncology

## 2014-05-02 VITALS — BP 181/81 | HR 74 | Temp 98.0°F | Resp 18 | Ht 65.0 in | Wt 152.1 lb

## 2014-05-02 DIAGNOSIS — C20 Malignant neoplasm of rectum: Secondary | ICD-10-CM

## 2014-05-02 NOTE — Progress Notes (Signed)
  [place] OFFICE PROGRESS NOTE   Diagnosis: Rectal cancer  INTERVAL HISTORY:   Stephanie Burns returns as scheduled. She feels well. No problem with the colostomy. She has intermittent drainage from the rectum. This sometimes contains blood. No pain.  Objective:  Vital signs in last 24 hours:  Blood pressure 181/81, pulse 74, temperature 98 F (36.7 C), temperature source Oral, resp. rate 18, height $RemoveBe'5\' 5"'[id number]$  (1.651 m), weight 152 lb 1.6 oz (68.992 kg), SpO2 100 %.    Resp: Lungs clear bilaterally Cardio: Regular rate and rhythm GI: No hepatomegaly, left lower quadrant colostomy, nontender Rectal: Approximately 4 cm proximal to the anal verge there is stricturing with anterior nodularity. No blood on the examination glove Vascular: Trace edema at the left greater than right lower leg  Medications: I have reviewed the patient's current medications.  Assessment/Plan: 1. Rectal cancer, clinical stage II (uT3, uN0).  Microsatellite stable, no loss of mismatch repair protein expression.   Trial of Xeloda 1 week on/1 week off beginning around 01/17/2013.   CEA improved 03/06/2013.  Xeloda continued until approximately May of 2015 when she developed a bowel obstruction requiring a diverting colostomy 2. Rectal pressure/urgency secondary to #1. Improved. 3. Remote history of uterine cancer treated with surgery and adjuvant radiation. 4. History of Early hand-foot syndrome manifesting with erythema over the palms and soles.  Disposition:  She appears unchanged. She will seek medical attention for increased rectal bleeding or pain. [name] will return to [place] for the summer beginning in early May. She will be scheduled for office visit here in September.   [name], MD  05/02/2014  10:34 AM

## 2014-05-02 NOTE — Telephone Encounter (Signed)
Gave avs & calendar for September °

## 2014-10-21 ENCOUNTER — Ambulatory Visit (HOSPITAL_BASED_OUTPATIENT_CLINIC_OR_DEPARTMENT_OTHER): Payer: Medicare Other | Admitting: Oncology

## 2014-10-21 ENCOUNTER — Telehealth: Payer: Self-pay | Admitting: Oncology

## 2014-10-21 VITALS — BP 173/74 | HR 93 | Temp 97.6°F | Resp 18 | Ht 65.0 in | Wt 158.0 lb

## 2014-10-21 DIAGNOSIS — C20 Malignant neoplasm of rectum: Secondary | ICD-10-CM | POA: Diagnosis not present

## 2014-10-21 NOTE — Progress Notes (Signed)
  [place] OFFICE PROGRESS NOTE   Diagnosis: Rectal cancer  INTERVAL HISTORY:   Stephanie Burns returns as scheduled. She just returned from [place]. She reports seeing her oncologist once while she was in [place] for the summer. No complaint. Good appetite. No pain. Occasional mild bleeding when she wipes when urinating. Small amount of mucus per rectum. The colostomy is functioning well. She has had a flu shot this year. She has an occasional feeling of rectal urgency.  Objective:  Vital signs in last 24 hours:  Blood pressure 173/74, pulse 93, temperature 97.6 F (36.4 C), temperature source Oral, resp. rate 18, height 5' 5" (1.651 m), weight 158 lb (71.668 kg), SpO2 98 %.    Resp: Lungs clear bilaterally Cardio: Irregular, 2/6 systolic murmur GI: No hepatomegaly, nontender, left lower quadrant colostomy Vascular: Trace edema to left lower leg Lymph nodes: No inguinal nodes     Medications: I have reviewed the patient's current medications.  Assessment/Plan: 1. Rectal cancer, clinical stage II (uT3, uN0).  Microsatellite stable, no loss of mismatch repair protein expression.   Trial of Xeloda 1 week on/1 week off beginning around 01/17/2013.   CEA improved 03/06/2013.  Xeloda continued until approximately May of 2015 when she developed a bowel obstruction requiring a diverting colostomy 2. Rectal pressure/urgency secondary to #1. Improved. 3. Remote history of uterine cancer treated with surgery and adjuvant radiation. 4. History of Early hand-foot syndrome manifesting with erythema over the palms and soles. 5.    Disposition:  Stephanie Burns appears stable. She does not have significant symptoms related to the rectal cancer. She reports irregular heart rate is chronic.  She will return for an office visit in 3 months.  [name], MD  10/21/2014  10:20 AM

## 2014-10-21 NOTE — Telephone Encounter (Signed)
Gave and pritned appt sched and avs for pt for DEC °

## 2015-01-10 ENCOUNTER — Other Ambulatory Visit: Payer: Self-pay | Admitting: *Deleted

## 2015-01-10 ENCOUNTER — Telehealth: Payer: Self-pay | Admitting: Oncology

## 2015-01-10 NOTE — Telephone Encounter (Signed)
per pof to CX pt lab-called & spke to pt & adv pt no labs appt @10 

## 2015-01-13 ENCOUNTER — Ambulatory Visit (HOSPITAL_BASED_OUTPATIENT_CLINIC_OR_DEPARTMENT_OTHER): Payer: Medicare Other | Admitting: Oncology

## 2015-01-13 ENCOUNTER — Other Ambulatory Visit: Payer: Medicare Other

## 2015-01-13 ENCOUNTER — Telehealth: Payer: Self-pay | Admitting: Oncology

## 2015-01-13 VITALS — BP 117/98 | HR 76 | Temp 97.8°F | Resp 18 | Ht 65.0 in | Wt 160.7 lb

## 2015-01-13 DIAGNOSIS — Z8542 Personal history of malignant neoplasm of other parts of uterus: Secondary | ICD-10-CM

## 2015-01-13 DIAGNOSIS — Z23 Encounter for immunization: Secondary | ICD-10-CM

## 2015-01-13 DIAGNOSIS — C20 Malignant neoplasm of rectum: Secondary | ICD-10-CM | POA: Diagnosis not present

## 2015-01-13 MED ORDER — PNEUMOCOCCAL 13-VAL CONJ VACC IM SUSP
0.5000 mL | Freq: Once | INTRAMUSCULAR | Status: AC
  Administered 2015-01-13: 0.5 mL via INTRAMUSCULAR
  Filled 2015-01-13: qty 0.5

## 2015-01-13 NOTE — Progress Notes (Signed)
  [place] OFFICE PROGRESS NOTE   Diagnosis: Rectal cancer  INTERVAL HISTORY:   She returns as scheduled. She feels well. No rectal pain. She continues to have discharge from the rectum. This sometimes contains blood. No frank bleeding. No other complaint.  Objective:  Vital signs in last 24 hours:  Blood pressure 117/98, pulse 18, temperature 97.8 F (36.6 C), temperature source Oral, resp. rate 18, height _0  (1.651 m), weight 160 lb 11.2 oz (72.893 kg), SpO2 98 %.    HEENT: Neck without mass Lymphatics: No cervical, supra-clavicular, or inguinal nodes Resp: Lungs clear bilaterally Cardio: Regular rate and rhythm GI: No hepatomegaly, left lower quadrant colostomy, nontender Vascular: Trace low leg edema bilaterally    Medications: I have reviewed the patient's current medications.  Assessment/Plan: 1. Rectal cancer, clinical stage II (uT3, uN0).  Microsatellite stable, no loss of mismatch repair protein expression.   Trial of Xeloda 1 week on/1 week off beginning around 01/17/2013.   CEA improved 03/06/2013.  Xeloda continued until approximately May of 2015 when she developed a bowel obstruction requiring a diverting colostomy 2. Rectal pressure/urgency secondary to #1. Improved. 3. Remote history of uterine cancer treated with surgery and adjuvant radiation. 4. History of Early hand-foot syndrome manifesting with erythema over the palms and soles.   Disposition:  She appears stable. She does not appear to have significant symptoms related to the rectal cancer. She will contact us for increased bleeding or pain. Ms. Sabater will return for an office visit in March 2017. She plans to return to [place] in April 2017. She received a 13 valent pneumonia vaccine today.  [name], MD  01/13/2015  10:45 AM

## 2015-01-13 NOTE — Telephone Encounter (Signed)
Gave and pritned appt sched and avs for pt for march 2017

## 2015-04-21 ENCOUNTER — Telehealth: Payer: Self-pay | Admitting: Oncology

## 2015-04-21 ENCOUNTER — Ambulatory Visit (HOSPITAL_BASED_OUTPATIENT_CLINIC_OR_DEPARTMENT_OTHER): Payer: Medicare Other | Admitting: Oncology

## 2015-04-21 VITALS — BP 170/83 | HR 92 | Temp 98.4°F | Resp 18 | Ht 65.0 in | Wt 158.7 lb

## 2015-04-21 DIAGNOSIS — Z8542 Personal history of malignant neoplasm of other parts of uterus: Secondary | ICD-10-CM

## 2015-04-21 DIAGNOSIS — R152 Fecal urgency: Secondary | ICD-10-CM

## 2015-04-21 DIAGNOSIS — C20 Malignant neoplasm of rectum: Secondary | ICD-10-CM

## 2015-04-21 NOTE — Progress Notes (Signed)
  [place] OFFICE PROGRESS NOTE   Diagnosis: Rectal cancer  INTERVAL HISTORY:   [name] returns as scheduled. She feels well. Good appetite. She continues to have rectal urgency. No pain. She has a small amount of output from the rectum with occasional bleeding. She plans to travel to [place] in May.  Objective:  Vital signs in last 24 hours:  Blood pressure 170/83, pulse 92, temperature 98.4 F (36.9 C), temperature source Oral, resp. rate 18, height '5\' 5"'$  (1.651 m), weight 158 lb 11.2 oz (71.986 kg), SpO2 99 %.   Resp: Lungs clear bilaterally Cardio: Regular rate and rhythm GI: No hepatomegaly, left lower quadrant colostomy, nontender Vascular: Trace edema at the left greater than right lower leg Rectal: There is an obstructing mass beginning 3-4 cm into the rectum, small amount of blood on the examination glove     Lab Results:  Lab Results  Component Value Date   WBC 5.2 05/28/2013   HGB 11.0* 05/28/2013   HCT 33.5* 05/28/2013   MCV 103.1* 05/28/2013   PLT 184 05/28/2013   NEUTROABS 3.1 05/28/2013    Medications: I have reviewed the patient's current medications.  Assessment/Plan: 1. Rectal cancer, clinical stage II (uT3, uN0).  Microsatellite stable, no loss of mismatch repair protein expression.   Trial of Xeloda 1 week on/1 week off beginning around 01/17/2013.   CEA improved 03/06/2013.  Xeloda continued until approximately May of 2015 when she developed a bowel obstruction requiring a diverting colostomy 2. Rectal pressure/urgency secondary to #1. Improved. 3. Remote history of uterine cancer treated with surgery and adjuvant radiation. 4. History of Early hand-foot syndrome manifesting with erythema over the palms and soles.   Disposition:  Stephanie Burns appears stable. She has rectal urgency, but otherwise feels well. I discussed treatment options with Stephanie Burns and her daughter. We discussed CAPOX chemotherapy. She understands  no therapy will be curative. She does not wish to begin chemotherapy at present.  Stephanie Burns will relocate to [place] for the summer beginning in early May. She will return here in September. I encouraged her to schedule an appointment with her oncologist in [place] for May or June.  She will return for an office visit here in September. I am available to see her sooner as needed.  [name], MD  04/21/2015  9:58 AM

## 2015-04-21 NOTE — Telephone Encounter (Signed)
Gave and printed appt shced and avs for pt for Sept  °

## 2015-10-16 ENCOUNTER — Ambulatory Visit (HOSPITAL_BASED_OUTPATIENT_CLINIC_OR_DEPARTMENT_OTHER): Payer: Medicare Other | Admitting: Oncology

## 2015-10-16 ENCOUNTER — Ambulatory Visit (HOSPITAL_BASED_OUTPATIENT_CLINIC_OR_DEPARTMENT_OTHER): Payer: Medicare Other

## 2015-10-16 ENCOUNTER — Telehealth: Payer: Self-pay | Admitting: Oncology

## 2015-10-16 VITALS — BP 172/84 | HR 101 | Temp 98.5°F | Resp 18 | Ht 65.0 in | Wt 161.9 lb

## 2015-10-16 DIAGNOSIS — C20 Malignant neoplasm of rectum: Secondary | ICD-10-CM | POA: Diagnosis not present

## 2015-10-16 DIAGNOSIS — R58 Hemorrhage, not elsewhere classified: Secondary | ICD-10-CM

## 2015-10-16 DIAGNOSIS — Z8542 Personal history of malignant neoplasm of other parts of uterus: Secondary | ICD-10-CM

## 2015-10-16 LAB — CBC WITH DIFFERENTIAL/PLATELET
BASO%: 0.9 % (ref 0.0–2.0)
Basophils Absolute: 0.1 10*3/uL (ref 0.0–0.1)
EOS ABS: 0.2 10*3/uL (ref 0.0–0.5)
EOS%: 2.8 % (ref 0.0–7.0)
HEMATOCRIT: 36.8 % (ref 34.8–46.6)
HEMOGLOBIN: 12 g/dL (ref 11.6–15.9)
LYMPH%: 19.7 % (ref 14.0–49.7)
MCH: 30.3 pg (ref 25.1–34.0)
MCHC: 32.5 g/dL (ref 31.5–36.0)
MCV: 93.2 fL (ref 79.5–101.0)
MONO#: 0.9 10*3/uL (ref 0.1–0.9)
MONO%: 13.2 % (ref 0.0–14.0)
NEUT%: 63.4 % (ref 38.4–76.8)
NEUTROS ABS: 4.3 10*3/uL (ref 1.5–6.5)
PLATELETS: 236 10*3/uL (ref 145–400)
RBC: 3.95 10*6/uL (ref 3.70–5.45)
RDW: 14.3 % (ref 11.2–14.5)
WBC: 6.8 10*3/uL (ref 3.9–10.3)
lymph#: 1.3 10*3/uL (ref 0.9–3.3)

## 2015-10-16 NOTE — Telephone Encounter (Signed)
Gave relative avs report and appointments for October.  °

## 2015-10-16 NOTE — Progress Notes (Signed)
  [place] OFFICE PROGRESS NOTE   Diagnosis: Rectal cancer  INTERVAL HISTORY:   [name] returns as scheduled. She return from [place] on 10/13/2015. She did not see a physician while she was in [place]. She was unable to obtain an appointment with the oncologist. She has noted the onset of vaginal bleeding beginning 10/13/2015. She has brown discharge from the rectum. The colostomy is functioning well. She otherwise feels well.   Objective:  Vital signs in last 24 hours:  Blood pressure (!) 172/84, pulse (!) 101, temperature 98.5 F (36.9 C), temperature source Oral, resp. rate 18, height '5\' 5"'$  (1.651 m), weight 161 lb 14.4 oz (73.4 kg), SpO2 100 %.    HEENT: Neck without mass Resp: Lungs clear bilaterally Cardio: Regular rate and rhythm GI: No hepatomegaly, left lower quadrant colostomy, no mass, nontender Vascular: The left lower leg is larger than the right side Rectal: There is nodular tumor at the distal rectum and at the posterior vagina. There is oozing of blood from the rectum.    Medications: I have reviewed the patient's current medications.  Assessment/Plan: 1. Rectal cancer, clinical stage II (uT3, uN0).  Microsatellite stable, no loss of mismatch repair protein expression.   Trial of Xeloda 1 week on/1 week off beginning around 01/17/2013.   CEA improved 03/06/2013.  Xeloda continued until approximately May of 2015 when she developed a bowel obstruction requiring a diverting colostomy 2. Rectal pressure/urgency secondary to #1. Improved. 3. Remote history of uterine cancer treated with surgery and adjuvant radiation. 4. History of Early hand-foot syndrome manifesting with erythema over the palms and soles. 5. Bleeding secondary to progression of rectal/vaginal tumor    Disposition:  Stephanie Burns has locally advanced rectal cancer. She now has bleeding from the rectum/vagina secondary to local progression of tumor. I discussed  treatment options with [name] and her daughter. She has no pain. Chemotherapy would have a small chance of improving the bleeding. I will contact Dr. Lisbeth Renshaw to discuss palliative radiation. We will check the hemoglobin today. She will return for an office visit in 3 weeks. She will contact us for increased bleeding.  [name], MD  10/16/2015  11:06 AM

## 2015-10-17 ENCOUNTER — Telehealth: Payer: Self-pay

## 2015-10-17 NOTE — Telephone Encounter (Signed)
-----   Message from Ladell Pier, MD sent at 10/16/2015  4:19 PM EDT ----- Please call patient, hb is ok

## 2015-10-17 NOTE — Telephone Encounter (Signed)
Called and informed pt of normal hgb result. Pt verbalized understanding and denies any questions or concerns at this time

## 2015-10-20 NOTE — Progress Notes (Addendum)
Reconsult Rectal Cancer Stage II = Recurrent bleeding rectal and vaginal (progression of rectal/vaginal tumor )  Radiation therapy for Uterine cancer  Years ago in [place]  and surgery,   Seen by Dr. Lisbeth Renshaw  11/20/2012  No Pacemaker  Was in [place]    didn't see  Oncologist there,returned here 10/13/15  Dr. Benay Spice note 9./14/17 =  referral for palliative radiation  Was on Xeloda  1 week on 1 week off from 01/17/13 - 06/2013 Vaginal bleeding,brown discharge from rectum, has a left lower  colostomy , has follow up appt 11/06/15 Still has bloody/browinish discharge from rectum,  No c/o pain or nausea BP (!) 181/92 (BP Location: Left Arm, Patient Position: Sitting, Cuff Size: Normal)   Pulse (!) 102   Temp 97.8 F (36.6 C) (Oral)   Resp 20   Wt 161 lb 12.8 oz (73.4 kg)   BMI 28.21 kg/m   Wt Readings from Last 3 Encounters:  10/24/15 161 lb 12.8 oz (73.4 kg)  10/24/15 161 lb 12.8 oz (73.4 kg)  10/16/15 161 lb 14.4 oz (73.4 kg)     Ct simulation Rectum today 10/24/15 at 3pm

## 2015-10-24 ENCOUNTER — Ambulatory Visit
Admission: RE | Admit: 2015-10-24 | Discharge: 2015-10-24 | Disposition: A | Discharge status: Home / Self Care | Payer: Medicare Other | Source: Ambulatory Visit | Attending: Radiation Oncology | Admitting: Radiation Oncology

## 2015-10-24 ENCOUNTER — Ambulatory Visit: Payer: Medicare Other | Admitting: Radiation Oncology

## 2015-10-24 ENCOUNTER — Encounter: Payer: Self-pay | Admitting: Radiation Oncology

## 2015-10-24 DIAGNOSIS — C20 Malignant neoplasm of rectum: Secondary | ICD-10-CM

## 2015-10-24 DIAGNOSIS — Z8542 Personal history of malignant neoplasm of other parts of uterus: Secondary | ICD-10-CM | POA: Insufficient documentation

## 2015-10-24 DIAGNOSIS — Z933 Colostomy status: Secondary | ICD-10-CM | POA: Diagnosis not present

## 2015-10-24 DIAGNOSIS — Z510 Encounter for antineoplastic radiation therapy: Secondary | ICD-10-CM | POA: Diagnosis not present

## 2015-10-24 NOTE — Progress Notes (Signed)
Radiation Oncology         [phone] ________________________________  Name: Stephanie Burns MRN: [id number]  Date: 10/24/2015  DOB: 10-Aug-1918  [id number], [place], MD  Ladell Pier, MD     REFERRING PHYSICIAN: Ladell Pier, MD   DIAGNOSIS: The encounter diagnosis was Rectal cancer [facility]).   HISTORY OF PRESENT ILLNESS: Stephanie Burns is a 82 y.o. female seen at the request of Dr. Benay Spice for a history of stage II, T3, N0 adenocarcinoma of the rectum and with concerns for recurrent disease diagnosed in 2014. She received Xeloda and radiotherapy and has been without evidence of disease since. She did require a diverting colostomy after experiencing a bowel obstruction in May of 2015. She has been followed expectantly since. Of note she does have a remote history of uterine cancer treated with surgery followed by radiotherapy in [place] almost 20 years ago. Over the summer while in [place], where she lives part of the year, she developed rectal bleeding. She has continued to have more frequent episodes of this, and was seen by Dr. Benay Spice for her symptoms last week. She comes to discuss possible options of palliative radiotherapy to help her bleeding.    PREVIOUS RADIATION THERAPY: Yes   01/15/13- January 2015: Radiotherapy to the rectum including boost.  Yes the patient was treated with radiation for uterine cancer approximately 20 years ago in [facility]. The radiation records for this treatment have been requested. From discussing this with the patient it appears that she was treated for approximately 6 weeks and I am assuming from her description that she received whole pelvic external beam radiation as part of her overall treatment plan  PAST MEDICAL HISTORY:  Past Medical History:  Diagnosis Date  . Arthritis    fingers  . Cataract 11-15-12   one eye  . Passage of bloody stools 11-15-12   occ.  Marland Kitchen Uterine cancer ([place])    12-15 years ago.  Tx'd with XRT      PAST SURGICAL HISTORY: Past Surgical History:  Procedure Laterality Date  . ABDOMINAL HYSTERECTOMY    . COLONOSCOPY WITH PROPOFOL N/A 11/16/2012   Procedure: COLONOSCOPY WITH PROPOFOL;  Surgeon: Milus Banister, MD;  Location: [facility];  Service: Endoscopy;  Laterality: N/A;  . EUS N/A 11/16/2012   Procedure: LOWER ENDOSCOPIC ULTRASOUND (EUS);  Surgeon: Milus Banister, MD;  Location: [facility];  Service: Endoscopy;  Laterality: N/A;  Possible EUS     FAMILY HISTORY:  Family History  Problem Relation Age of Onset  . Uterine cancer Mother   . Cancer Mother     cant remember     SOCIAL HISTORY:  reports that she has never smoked. She has never used smokeless tobacco. She reports that she does not drink alcohol or use drugs. The patient is active and lives in [place] half of the year, and spends the fall and winter in [place] with her daughter.    ALLERGIES: Review of patient's allergies indicates no known allergies.   MEDICATIONS:  Current Outpatient Prescriptions  Medication Sig Dispense Refill  . Cholecalciferol (VITAMIN D-3) 1000 UNITS CAPS Take 1,000 Units by mouth daily.    . vitamin B-12 (CYANOCOBALAMIN) 1000 MCG tablet Take 1,000 mcg by mouth daily.     No current facility-administered medications for this encounter.      REVIEW OF SYSTEMS: On review of systems, the patient reports that she is doing well overall. She denies any chest pain, shortness  of breath, cough, fevers, chills, night sweats, unintended weight changes. She reports her bleeding episodes are a light staining she sees this daily now but does not fill a pad or pantiliner. She denies any bowel or bladder disturbances, and denies abdominal pain, nausea or vomiting. She denies any new musculoskeletal or joint aches or pains. A complete review of systems is obtained and is otherwise negative.     PHYSICAL EXAM:  weight is 161 lb 12.8 oz (73.4 kg). Her oral temperature is 97.8 F (36.6 C). Her  blood pressure is 181/92 (abnormal) and her pulse is 102 (abnormal). Her respiration is 20.   Pain scale 0/10 In general this is a well appearing caucasian female in no acute distress. She is alert and oriented x4 and appropriate throughout the examination. HEENT reveals that the patient is normocephalic, atraumatic. EOMs are intact. PERRLA. Skin is intact without any evidence of gross lesions. Cardiovascular exam reveals a regular rate and rhythm, no clicks rubs or murmurs are auscultated. Chest is clear to auscultation bilaterally. Lymphatic assessment is performed and does not reveal any adenopathy in the cervical, supraclavicular, axillary, or inguinal chains. Abdomen has active bowel sounds in all quadrants and is intact. The abdomen is soft, non tender, non distended. Lower extremities are negative for pretibial pitting edema, deep calf tenderness, cyanosis or clubbing. Pelvic exam reveal atrophic normal appearing external female genitalia. No lesions are seen grossly. Speculum exam reveals no visible tumor, she has foreshortening of her vagina. Bimanual exam does not reveal palpable cervical tissue, there is scarring along the left wall this scarring is manually lysed. Minimal bleeding is noted. On rectovaginal exam, about 2cm from the anal verge, there is a circumferential tumor, thickened, and fixed. Blood is noted on the glove after examination.   ECOG = 1  0 - Asymptomatic (Fully active, able to carry on all predisease activities without restriction)  1 - Symptomatic but completely ambulatory (Restricted in physically strenuous activity but ambulatory and able to carry out work of a light or sedentary nature. For example, light housework, office work)  2 - Symptomatic, <50% in bed during the day (Ambulatory and capable of all self care but unable to carry out any work activities. Up and about more than 50% of waking hours)  3 - Symptomatic, >50% in bed, but not bedbound (Capable of only  limited self-care, confined to bed or chair 50% or more of waking hours)  4 - Bedbound (Completely disabled. Cannot carry on any self-care. Totally confined to bed or chair)  5 - Death   Eustace Pen MM, Creech RH, Tormey DC, et al. [phone]). "Toxicity and response criteria of the [facility]". [place] Oncol. 5 (6): 649-55    LABORATORY DATA:  Lab Results  Component Value Date   WBC 6.8 10/16/2015   HGB 12.0 10/16/2015   HCT 36.8 10/16/2015   MCV 93.2 10/16/2015   PLT 236 10/16/2015   Lab Results  Component Value Date   NA 141 05/28/2013   K 3.8 05/28/2013   CL 105 11/14/2012   CO2 25 05/28/2013   Lab Results  Component Value Date   ALT 11 05/28/2013   AST 23 05/28/2013   ALKPHOS 105 05/28/2013   BILITOT 0.93 05/28/2013      RADIOGRAPHY: No results found.     IMPRESSION/PLAN: 1. Recurrent Stage II, T3, N0 adenocarcinoma of the rectum. The patient's symptoms of bleeding appear to be of greatest concern. At this point in time she  has been counseled by Dr. Benay Spice that palliative chemotherapy could be an option, but she is not interested in this, and rather, palliative radiotherapy would be a more rapid intervention to control her symptoms of bleeding. Dr. Lisbeth Renshaw has reviewed her previous treatment plans and does discuss the increased risks of tissue compromise and toxicity given her previous treatment, however there is room to give palliative radiotherapy to control her bleeding. He would recommend 30 Gy in 10 fractions. We will plan to proceed with simulation next week. Written consent is obtained. Again the risks, benefits, short, and long term effects were detailed and she is interested in proceeding.   The above documentation reflects my direct findings during this shared patient visit. Please see the separate note by Dr. Lisbeth Renshaw on this date for the remainder of the patient's plan of care.    Carola Rhine, PAC

## 2015-10-28 ENCOUNTER — Ambulatory Visit
Admission: RE | Admit: 2015-10-28 | Discharge: 2015-10-28 | Disposition: A | Discharge status: Home / Self Care | Payer: Medicare Other | Source: Ambulatory Visit | Attending: Radiation Oncology | Admitting: Radiation Oncology

## 2015-10-28 DIAGNOSIS — C20 Malignant neoplasm of rectum: Secondary | ICD-10-CM

## 2015-10-28 DIAGNOSIS — Z510 Encounter for antineoplastic radiation therapy: Secondary | ICD-10-CM | POA: Diagnosis not present

## 2015-10-31 NOTE — Progress Notes (Signed)
  Radiation Oncology         [phone] ________________________________  Name: Stephanie Burns MRN: [id number]  Date: 10/28/2015  DOB: [birth year]  Optical Surface Tracking Plan:  Since intensity modulated radiotherapy (IMRT) and 3D conformal radiation treatment methods are predicated on accurate and precise positioning for treatment, intrafraction motion monitoring is medically necessary to ensure accurate and safe treatment delivery.  The ability to quantify intrafraction motion without excessive ionizing radiation dose can only be performed with optical surface tracking. Accordingly, surface imaging offers the opportunity to obtain 3D measurements of patient position throughout IMRT and 3D treatments without excessive radiation exposure.  I am ordering optical surface tracking for this patient's upcoming course of radiotherapy. ________________________________  Kyung Rudd, MD 10/31/2015 8:47 AM    Reference:   Particia Jasper, et al. Surface imaging-based analysis of intrafraction motion for breast radiotherapy patients.Journal of [place], n. 6, nov. 2014. ISSN [id number].   Available at: <http://www.jacmp.org/index.php/jacmp/article/view/4957>.

## 2015-10-31 NOTE — Progress Notes (Signed)
  Radiation Oncology         ([phone]) [phone] ________________________________  Name: Stephanie Burns MRN: [id number]  Date: 10/28/2015  DOB: 05-02-1918  SIMULATION AND TREATMENT PLANNING NOTE  DIAGNOSIS:     ICD-9-CM ICD-10-CM   1. Rectal cancer - distal circumferential, uT3uN0 154.1 C20      Site:  Lower rectum  NARRATIVE:  The patient was brought to the [facility].  Identity was confirmed.  All relevant records and images related to the planned course of therapy were reviewed.   Written consent to proceed with treatment was confirmed which was freely given after reviewing the details related to the planned course of therapy had been reviewed with the patient.  Then, the patient was set-up in a stable reproducible  supine position for radiation therapy.  CT images were obtained.  Surface markings were placed.    Medically necessary complex treatment device(s) for immobilization:  Vac lock.   The CT images were loaded into the planning software.  Then the target and avoidance structures were contoured.  Treatment planning then occurred.  The radiation prescription was entered and confirmed.  A total of 3 complex treatment devices were fabricated which relate to the designed radiation treatment fields. Each of these customized fields/ complex treatment devices will be used on a daily basis during the radiation course. I have requested : 3D Simulation  I have requested a DVH of the following structures: Target volume, bladder, rectum, femoral heads bilaterally.   PLAN:  The patient will receive 30 Gy in 10 fractions.  Special treatment procedure This represents a course of reirradiation to the pelvis. This will require extra planning/work to account for this and the patient will be monitored closely for increased difficulties. This therefore constitutes a special treatment procedure.  ________________________________   Jodelle Gross, MD, PhD

## 2015-11-04 ENCOUNTER — Ambulatory Visit
Admission: RE | Admit: 2015-11-04 | Discharge: 2015-11-04 | Disposition: A | Discharge status: Home / Self Care | Payer: Medicare Other | Source: Ambulatory Visit | Attending: Radiation Oncology | Admitting: Radiation Oncology

## 2015-11-04 DIAGNOSIS — Z510 Encounter for antineoplastic radiation therapy: Secondary | ICD-10-CM | POA: Diagnosis not present

## 2015-11-05 ENCOUNTER — Ambulatory Visit
Admission: RE | Admit: 2015-11-05 | Discharge: 2015-11-05 | Disposition: A | Discharge status: Home / Self Care | Payer: Medicare Other | Source: Ambulatory Visit | Attending: Radiation Oncology | Admitting: Radiation Oncology

## 2015-11-05 DIAGNOSIS — Z510 Encounter for antineoplastic radiation therapy: Secondary | ICD-10-CM | POA: Diagnosis not present

## 2015-11-06 ENCOUNTER — Ambulatory Visit
Admission: RE | Admit: 2015-11-06 | Discharge: 2015-11-06 | Disposition: A | Discharge status: Home / Self Care | Payer: Medicare Other | Source: Ambulatory Visit | Attending: Radiation Oncology | Admitting: Radiation Oncology

## 2015-11-06 ENCOUNTER — Ambulatory Visit (HOSPITAL_BASED_OUTPATIENT_CLINIC_OR_DEPARTMENT_OTHER): Payer: Medicare Other | Admitting: Nurse Practitioner

## 2015-11-06 ENCOUNTER — Telehealth: Payer: Self-pay | Admitting: Nurse Practitioner

## 2015-11-06 VITALS — BP 163/75 | HR 83 | Temp 98.4°F | Resp 18 | Ht 63.5 in | Wt 163.2 lb

## 2015-11-06 DIAGNOSIS — Z8542 Personal history of malignant neoplasm of other parts of uterus: Secondary | ICD-10-CM | POA: Diagnosis not present

## 2015-11-06 DIAGNOSIS — K625 Hemorrhage of anus and rectum: Secondary | ICD-10-CM | POA: Diagnosis not present

## 2015-11-06 DIAGNOSIS — C20 Malignant neoplasm of rectum: Secondary | ICD-10-CM | POA: Diagnosis not present

## 2015-11-06 DIAGNOSIS — R152 Fecal urgency: Secondary | ICD-10-CM | POA: Diagnosis not present

## 2015-11-06 DIAGNOSIS — Z510 Encounter for antineoplastic radiation therapy: Secondary | ICD-10-CM | POA: Diagnosis not present

## 2015-11-06 NOTE — Progress Notes (Signed)
  [place] OFFICE PROGRESS NOTE   Diagnosis:  Rectal cancer  INTERVAL HISTORY:   Ms. Kachel returns as scheduled. She began palliative radiation 11/04/2015. She continues to have intermittent rectal bleeding. She has "pressure" at the rectum. No vaginal bleeding.  Objective:  Vital signs in last 24 hours:  Blood pressure (!) 163/75, pulse 83, temperature 98.4 F (36.9 C), temperature source Oral, resp. rate 18, height 5' 3.5" (1.613 m), weight 163 lb 3.2 oz (74 kg), SpO2 99 %.    HEENT: No thrush or ulcers. Resp: Lungs clear bilaterally. Cardio: Regular rate and rhythm. GI: No hepatomegaly. Left lower quadrant colostomy. No mass. Vascular: Trace lower leg edema bilaterally left greater than right.  Lab Results:  Lab Results  Component Value Date   WBC 6.8 10/16/2015   HGB 12.0 10/16/2015   HCT 36.8 10/16/2015   MCV 93.2 10/16/2015   PLT 236 10/16/2015   NEUTROABS 4.3 10/16/2015    Imaging:  No results found.  Medications: I have reviewed the patient's current medications.  Assessment/Plan: 1. Rectal cancer, clinical stage II (uT3, uN0).  Microsatellite stable, no loss of mismatch repair protein expression.   Trial of Xeloda 1 week on/1 week off beginning around 01/17/2013.   CEA improved 03/06/2013.  Xeloda continued until approximately May of 2015 when she developed a bowel obstruction requiring a diverting colostomy  Initiation of palliative radiation 11/04/2015 2. Rectal pressure/urgency secondary to #1. 3. Remote history of uterine cancer treated with surgery and adjuvant radiation. 4. History of Early hand-foot syndrome manifesting with erythema over the palms and soles. 5. Bleeding secondary to progression of rectal/vaginal tumor   Disposition: Ms. Tufaro appears stable. She continues palliative radiation. We will see her back on the final day of radiation, 11/17/2015. She will contact the office in the interim with any  problems.  Plan reviewed with Dr. Benay Spice.  Ned Card ANP/GNP-BC   11/06/2015  10:46 AM

## 2015-11-06 NOTE — Telephone Encounter (Signed)
Gave pt appt for 10/16.

## 2015-11-07 ENCOUNTER — Encounter: Payer: Self-pay | Admitting: Radiation Oncology

## 2015-11-07 ENCOUNTER — Ambulatory Visit
Admission: RE | Admit: 2015-11-07 | Discharge: 2015-11-07 | Disposition: A | Discharge status: Home / Self Care | Payer: Medicare Other | Source: Ambulatory Visit | Attending: Radiation Oncology | Admitting: Radiation Oncology

## 2015-11-07 VITALS — BP 152/69 | HR 64 | Temp 98.3°F | Resp 20 | Wt 163.2 lb

## 2015-11-07 DIAGNOSIS — Z510 Encounter for antineoplastic radiation therapy: Secondary | ICD-10-CM | POA: Diagnosis not present

## 2015-11-07 DIAGNOSIS — C20 Malignant neoplasm of rectum: Secondary | ICD-10-CM

## 2015-11-07 NOTE — Progress Notes (Signed)
   Department of Radiation Oncology  Phone:  [phone] Fax:        [phone]  Weekly Treatment Note    Name: Stephanie Burns Date: 11/07/2015 MRN: [id number] DOB: 03-07-1918   Diagnosis:     ICD-9-CM ICD-10-CM   1. Rectal cancer - distal circumferential, uT3uN0 154.1 C20      Current dose: 12 Gy  Current fraction: 4   MEDICATIONS: Current Outpatient Prescriptions  Medication Sig Dispense Refill  . Cholecalciferol (VITAMIN D-3) 1000 UNITS CAPS Take 1,000 Units by mouth daily.    . vitamin B-12 (CYANOCOBALAMIN) 1000 MCG tablet Take 1,000 mcg by mouth daily.     No current facility-administered medications for this encounter.      ALLERGIES: Review of patient's allergies indicates no known allergies.   LABORATORY DATA:  Lab Results  Component Value Date   WBC 6.8 10/16/2015   HGB 12.0 10/16/2015   HCT 36.8 10/16/2015   MCV 93.2 10/16/2015   PLT 236 10/16/2015   Lab Results  Component Value Date   NA 141 05/28/2013   K 3.8 05/28/2013   CL 105 11/14/2012   CO2 25 05/28/2013   Lab Results  Component Value Date   ALT 11 05/28/2013   AST 23 05/28/2013   ALKPHOS 105 05/28/2013   BILITOT 0.93 05/28/2013     NARRATIVE: [name] was seen today for weekly treatment management. The chart was checked and the patient's films were reviewed.  Weekly rad txs rectum 5/10 completed, has a colostomy, thin liquid, suggested low fiber diet , radiation therapy and you  book, leaflet on low fiber, my business card, discussed side effects and ways to manage them, , no c/o n[place], or pain, may need to eat 5-6 smaller meals and snacks if nauseated,  appetite good, teach backi 6:12 PM BP (!) 152/69 (BP Location: Left Arm, Patient Position: Sitting, Cuff Size: Normal)   Pulse 64   Temp 98.3 F (36.8 C) (Oral)   Resp 20   Wt 163 lb 3.2 oz (74 kg)   BMI 28.46 kg/m   Wt Readings from Last 3 Encounters:  11/07/15 163 lb 3.2 oz (74 kg)  11/06/15 163 lb 3.2 oz  (74 kg)  10/24/15 161 lb 12.8 oz (73.4 kg)    PHYSICAL EXAMINATION: weight is 163 lb 3.2 oz (74 kg). Her oral temperature is 98.3 F (36.8 C). Her blood pressure is 152/69 (abnormal) and her pulse is 64. Her respiration is 20.        ASSESSMENT: The patient is doing satisfactorily with treatment.  Possible improvement in terms of decreased bleeding. Overall the patient is doing quite well during this first week of her treatment  PLAN: We will continue with the patient's radiation treatment as planned.

## 2015-11-07 NOTE — Progress Notes (Signed)
Weekly rad txs rectum 5/10 completed, has a colostomy, thin liquid, suggested low fiber diet , radiation therapy and you  book, leaflet on low fiber, my business card, discussed side effects and ways to manage them, , no c/o n[place], or pain, may need to eat 5-6 smaller meals and snacks if nauseated,  appetite good, teach backi 12:55 PM BP (!) 152/69 (BP Location: Left Arm, Patient Position: Sitting, Cuff Size: Normal)   Pulse 64   Temp 98.3 F (36.8 C) (Oral)   Resp 20   Wt 163 lb 3.2 oz (74 kg)   BMI 28.46 kg/m   Wt Readings from Last 3 Encounters:  11/07/15 163 lb 3.2 oz (74 kg)  11/06/15 163 lb 3.2 oz (74 kg)  10/24/15 161 lb 12.8 oz (73.4 kg)

## 2015-11-10 ENCOUNTER — Ambulatory Visit
Admission: RE | Admit: 2015-11-10 | Discharge: 2015-11-10 | Disposition: A | Discharge status: Home / Self Care | Payer: Medicare Other | Source: Ambulatory Visit | Attending: Radiation Oncology | Admitting: Radiation Oncology

## 2015-11-10 DIAGNOSIS — Z510 Encounter for antineoplastic radiation therapy: Secondary | ICD-10-CM | POA: Diagnosis not present

## 2015-11-11 ENCOUNTER — Ambulatory Visit
Admission: RE | Admit: 2015-11-11 | Discharge: 2015-11-11 | Disposition: A | Discharge status: Home / Self Care | Payer: Medicare Other | Source: Ambulatory Visit | Attending: Radiation Oncology | Admitting: Radiation Oncology

## 2015-11-11 DIAGNOSIS — Z510 Encounter for antineoplastic radiation therapy: Secondary | ICD-10-CM | POA: Diagnosis not present

## 2015-11-12 ENCOUNTER — Ambulatory Visit
Admission: RE | Admit: 2015-11-12 | Discharge: 2015-11-12 | Disposition: A | Discharge status: Home / Self Care | Payer: Medicare Other | Source: Ambulatory Visit | Attending: Radiation Oncology | Admitting: Radiation Oncology

## 2015-11-12 DIAGNOSIS — Z510 Encounter for antineoplastic radiation therapy: Secondary | ICD-10-CM | POA: Diagnosis not present

## 2015-11-13 ENCOUNTER — Ambulatory Visit
Admission: RE | Admit: 2015-11-13 | Discharge: 2015-11-13 | Disposition: A | Discharge status: Home / Self Care | Payer: Medicare Other | Source: Ambulatory Visit | Attending: Radiation Oncology | Admitting: Radiation Oncology

## 2015-11-13 ENCOUNTER — Encounter: Payer: Self-pay | Admitting: Radiation Oncology

## 2015-11-13 VITALS — BP 153/51 | HR 89 | Temp 98.3°F | Resp 20 | Wt 161.8 lb

## 2015-11-13 DIAGNOSIS — C20 Malignant neoplasm of rectum: Secondary | ICD-10-CM

## 2015-11-13 DIAGNOSIS — Z510 Encounter for antineoplastic radiation therapy: Secondary | ICD-10-CM | POA: Diagnosis not present

## 2015-11-13 NOTE — Progress Notes (Signed)
Weekly rad tyx rectal, no c/o pain, has a colostomy, no nausea, no abdominal gas stated, appetite good, encouraged to drink more fluids, water, ambulates with a walker steady,  Drinks boost 1 can daily,  Ties easily when shoppi   Last Vitals:  Vitals:   11/13/15 0939  BP: (!) 153/51  Pulse: 89  Resp: 20  Temp: 98.3 F (36.8 C)    Wt Readings from Last 3 Encounters:  11/13/15 161 lb 12.8 oz (73.4 kg)  11/07/15 163 lb 3.2 oz (74 kg)  11/06/15 163 lb 3.2 oz (74 kg)   t

## 2015-11-13 NOTE — Progress Notes (Signed)
   Department of Radiation Oncology  Phone:  [phone] Fax:        [phone]  Weekly Treatment Note    Name: Stephanie Burns Date: 11/13/2015 MRN: [id number] DOB: 1918-04-10   Diagnosis:     ICD-9-CM ICD-10-CM   1. Rectal cancer - distal circumferential, uT3uN0 154.1 C20      Current dose: 24 Gy  Current fraction: 8   MEDICATIONS: Current Outpatient Prescriptions  Medication Sig Dispense Refill  . Cholecalciferol (VITAMIN D-3) 1000 UNITS CAPS Take 1,000 Units by mouth daily.    . vitamin B-12 (CYANOCOBALAMIN) 1000 MCG tablet Take 1,000 mcg by mouth daily.     No current facility-administered medications for this encounter.      ALLERGIES: Review of patient's allergies indicates no known allergies.   LABORATORY DATA:  Lab Results  Component Value Date   WBC 6.8 10/16/2015   HGB 12.0 10/16/2015   HCT 36.8 10/16/2015   MCV 93.2 10/16/2015   PLT 236 10/16/2015   Lab Results  Component Value Date   NA 141 05/28/2013   K 3.8 05/28/2013   CL 105 11/14/2012   CO2 25 05/28/2013   Lab Results  Component Value Date   ALT 11 05/28/2013   AST 23 05/28/2013   ALKPHOS 105 05/28/2013   BILITOT 0.93 05/28/2013     NARRATIVE: Stephanie Burns was seen today for weekly treatment management. The chart was checked and the patient's films were reviewed.  Weekly rad tyx rectal, no c/o pain, has a colostomy, no nausea, no abdominal gas stated, appetite good, encouraged to drink more fluids, water, ambulates with a walker steady,  Drinks boost 1 can daily,  Ties easily when shoppi   Last Vitals:  Vitals:   11/13/15 0939  BP: (!) 153/51  Pulse: 89  Resp: 20  Temp: 98.3 F (36.8 C)    Wt Readings from Last 3 Encounters:  11/13/15 161 lb 12.8 oz (73.4 kg)  11/07/15 163 lb 3.2 oz (74 kg)  11/06/15 163 lb 3.2 oz (74 kg)   t   PHYSICAL EXAMINATION: weight is 161 lb 12.8 oz (73.4 kg). Her oral temperature is 98.3 F (36.8 C). Her blood pressure is 153/51  (abnormal) and her pulse is 89. Her respiration is 20.        ASSESSMENT: The patient is doing satisfactorily with treatment.  PLAN: We will continue with the patient's radiation treatment as planned. The patient will finish her treatment on Monday and then will return to clinic in 1 month. I am pleased that the patient's tolerating treatment well and her bleeding has significantly improved.

## 2015-11-14 ENCOUNTER — Ambulatory Visit
Admission: RE | Admit: 2015-11-14 | Discharge: 2015-11-14 | Disposition: A | Discharge status: Home / Self Care | Payer: Medicare Other | Source: Ambulatory Visit | Attending: Radiation Oncology | Admitting: Radiation Oncology

## 2015-11-14 DIAGNOSIS — Z510 Encounter for antineoplastic radiation therapy: Secondary | ICD-10-CM | POA: Diagnosis not present

## 2015-11-17 ENCOUNTER — Encounter: Payer: Self-pay | Admitting: Radiation Oncology

## 2015-11-17 ENCOUNTER — Ambulatory Visit (HOSPITAL_BASED_OUTPATIENT_CLINIC_OR_DEPARTMENT_OTHER): Payer: Medicare Other | Admitting: Nurse Practitioner

## 2015-11-17 ENCOUNTER — Other Ambulatory Visit (HOSPITAL_BASED_OUTPATIENT_CLINIC_OR_DEPARTMENT_OTHER): Payer: Medicare Other

## 2015-11-17 ENCOUNTER — Ambulatory Visit
Admission: RE | Admit: 2015-11-17 | Discharge: 2015-11-17 | Disposition: A | Discharge status: Home / Self Care | Payer: Medicare Other | Source: Ambulatory Visit | Attending: Radiation Oncology | Admitting: Radiation Oncology

## 2015-11-17 ENCOUNTER — Telehealth: Payer: Self-pay | Admitting: Nurse Practitioner

## 2015-11-17 VITALS — BP 173/92 | HR 82 | Temp 97.9°F | Resp 17 | Ht 63.5 in | Wt 161.8 lb

## 2015-11-17 DIAGNOSIS — Z510 Encounter for antineoplastic radiation therapy: Secondary | ICD-10-CM | POA: Diagnosis not present

## 2015-11-17 DIAGNOSIS — K6289 Other specified diseases of anus and rectum: Secondary | ICD-10-CM

## 2015-11-17 DIAGNOSIS — C20 Malignant neoplasm of rectum: Secondary | ICD-10-CM | POA: Diagnosis not present

## 2015-11-17 LAB — CBC WITH DIFFERENTIAL/PLATELET
BASO%: 0.6 % (ref 0.0–2.0)
Basophils Absolute: 0 10*3/uL (ref 0.0–0.1)
EOS%: 4.7 % (ref 0.0–7.0)
Eosinophils Absolute: 0.3 10*3/uL (ref 0.0–0.5)
HEMATOCRIT: 40.1 % (ref 34.8–46.6)
HGB: 13 g/dL (ref 11.6–15.9)
LYMPH#: 1 10*3/uL (ref 0.9–3.3)
LYMPH%: 14.9 % (ref 14.0–49.7)
MCH: 30.5 pg (ref 25.1–34.0)
MCHC: 32.4 g/dL (ref 31.5–36.0)
MCV: 94.2 fL (ref 79.5–101.0)
MONO#: 0.9 10*3/uL (ref 0.1–0.9)
MONO%: 13.1 % (ref 0.0–14.0)
NEUT%: 66.7 % (ref 38.4–76.8)
NEUTROS ABS: 4.5 10*3/uL (ref 1.5–6.5)
PLATELETS: 230 10*3/uL (ref 145–400)
RBC: 4.25 10*6/uL (ref 3.70–5.45)
RDW: 13.9 % (ref 11.2–14.5)
WBC: 6.8 10*3/uL (ref 3.9–10.3)

## 2015-11-17 NOTE — Telephone Encounter (Signed)
Avs report and appointment schedule given to patient, per 11/17/15 los. °

## 2015-11-17 NOTE — Progress Notes (Addendum)
  [facility] OFFICE PROGRESS NOTE   Diagnosis:  Rectal cancer  INTERVAL HISTORY:   Stephanie Burns returns as scheduled. She completed the course of radiation earlier this morning. Main complaint is being "tired". She continues to note pressure at the rectum. No significant pain. She has a gelatinous-like discharge from the rectum. She periodically notes blood clots mixed with the discharge. She reports a good appetite.  Objective:  Vital signs in last 24 hours:  Blood pressure (!) 173/92, pulse 82, temperature 97.9 F (36.6 C), temperature source Oral, resp. rate 17, height 5' 3.5" (1.613 m), weight 161 lb 12.8 oz (73.4 kg), SpO2 100 %.    HEENT: No thrush or ulcers. Resp: Lungs clear bilaterally. Cardio: Regular rate and rhythm. GI: No hepatomegaly. Left lower quadrant colostomy. Vascular: Trace pitting edema at the lower legs bilaterally left greater than right.   Lab Results:  Lab Results  Component Value Date   WBC 6.8 11/17/2015   HGB 13.0 11/17/2015   HCT 40.1 11/17/2015   MCV 94.2 11/17/2015   PLT 230 11/17/2015   NEUTROABS 4.5 11/17/2015    Imaging:  No results found.  Medications: I have reviewed the patient's current medications.  Assessment/Plan: 1. Rectal cancer, clinical stage II (uT3, uN0).  Microsatellite stable, no loss of mismatch repair protein expression.   Trial of Xeloda 1 week on/1 week off beginning around 01/17/2013.   CEA improved 03/06/2013.  Xeloda continued until approximately May of 2015 when she developed a bowel obstruction requiring a diverting colostomy  Initiation of palliative radiation 11/04/2015; completion of palliative radiation 11/17/2015 2. Rectal pressure/urgency secondary to #1. 3. Remote history of uterine cancer treated with surgery and adjuvant radiation. 4. History of Early hand-foot syndrome manifesting with erythema over the palms and soles. 5. Bleeding secondary to progression of rectal/vaginal  tumor   Disposition: Stephanie Burns appears stable. She has completed the course of palliative radiation. The bleeding has improved and hemoglobin has normalized. We scheduled a return visit with a CBC in 6 weeks. She will contact the office in the interim with any problems.  Patient seen with Dr. Benay Spice.    Ned Card ANP/GNP-BC   11/17/2015  11:08 AM This was a shared visit with Ned Card. Stephanie Burns completed palliative radiation. The plan is to follow her with observation.  Julieanne Manson, M.D.

## 2015-11-18 ENCOUNTER — Ambulatory Visit: Payer: Medicare Other

## 2015-11-19 ENCOUNTER — Ambulatory Visit: Payer: Medicare Other

## 2015-11-19 NOTE — Progress Notes (Signed)
  Radiation Oncology         [phone]) [phone] ________________________________  Name: Stephanie Burns MRN: [id number]  Date: 11/17/2015  DOB: 1919/01/12  End of Treatment Note  Diagnosis:   Recurrent stage II, T3, N0 adenocarcinoma of the rectum     Indication for treatment: Palliative  Radiation treatment dates:   11/04/15 - 11/17/15  Site/dose: 30 Gy in 10 fractions to the pelvis  Beams/energy:  3D// 15X, 6X Photon  Narrative: The patient tolerated radiation treatment relatively well. The patient denied pain, nausea, or abdominal gas. The patient had a colostomy. She was encouraged to drink more fluids. Only complaint was tiring easily when ambulating. Possible improvement in terms of decreased bleeding.  Plan: The patient has completed radiation treatment. The patient will return to radiation oncology clinic for routine followup in one month. I advised them to call or return sooner if they have any questions or concerns related to their recovery or treatment.  ------------------------------------------------  Jodelle Gross, MD, PhD  This document serves as a record of services personally performed by Kyung Rudd, MD. It was created on his behalf by Darcus Austin, a trained medical scribe. The creation of this record is based on the scribe's personal observations and the provider's statements to them. This document has been checked and approved by the attending provider.

## 2015-11-20 ENCOUNTER — Ambulatory Visit: Payer: Medicare Other

## 2015-11-21 ENCOUNTER — Ambulatory Visit: Payer: Medicare Other

## 2015-11-24 ENCOUNTER — Ambulatory Visit: Payer: Medicare Other

## 2015-11-25 ENCOUNTER — Ambulatory Visit: Payer: Medicare Other

## 2015-11-26 ENCOUNTER — Ambulatory Visit: Payer: Medicare Other

## 2015-11-27 ENCOUNTER — Ambulatory Visit: Payer: Medicare Other

## 2015-11-28 ENCOUNTER — Ambulatory Visit: Payer: Medicare Other

## 2015-12-01 ENCOUNTER — Ambulatory Visit: Payer: Medicare Other

## 2015-12-02 ENCOUNTER — Ambulatory Visit: Payer: Medicare Other

## 2015-12-03 ENCOUNTER — Ambulatory Visit: Payer: Medicare Other

## 2015-12-04 ENCOUNTER — Ambulatory Visit: Payer: Medicare Other

## 2015-12-05 ENCOUNTER — Ambulatory Visit: Payer: Medicare Other

## 2015-12-08 ENCOUNTER — Ambulatory Visit: Payer: Medicare Other

## 2015-12-09 ENCOUNTER — Ambulatory Visit: Payer: Medicare Other

## 2015-12-10 ENCOUNTER — Ambulatory Visit: Payer: Medicare Other

## 2015-12-11 ENCOUNTER — Ambulatory Visit: Payer: Medicare Other

## 2015-12-12 ENCOUNTER — Ambulatory Visit: Payer: Medicare Other

## 2015-12-23 NOTE — Progress Notes (Signed)
  Mrs. Stephanie Burns. [name] is her for a one month follow up appointment for rectal cancer.   Rectal pain:No Rectal pressure/urgency:Yes feels like she has to have a bowel movement sometimes Early hand-foot syndrome with erythema over the palms and soles:Has gotten better Bleeding secondary to progression of rectal/vaginal tumor:Having a discharge from her rectum Wt Readings from Last 3 Encounters:  12/30/15 159 lb 3.2 oz (72.2 kg)  12/30/15 161 lb 1.6 oz (73.1 kg)  11/17/15 161 lb 12.8 oz (73.4 kg)  BP (!) 175/87   Pulse 74   Temp 98.1 F (36.7 C) (Oral)   Resp 17   Ht 5' 3.5" (1.613 m)   Wt 159 lb 3.2 oz (72.2 kg)   SpO2 98%   BMI 27.76 kg/m

## 2015-12-30 ENCOUNTER — Other Ambulatory Visit (HOSPITAL_BASED_OUTPATIENT_CLINIC_OR_DEPARTMENT_OTHER): Payer: Medicare Other

## 2015-12-30 ENCOUNTER — Ambulatory Visit (HOSPITAL_BASED_OUTPATIENT_CLINIC_OR_DEPARTMENT_OTHER): Payer: Medicare Other | Admitting: Oncology

## 2015-12-30 ENCOUNTER — Telehealth: Payer: Self-pay | Admitting: Oncology

## 2015-12-30 ENCOUNTER — Telehealth: Payer: Self-pay | Admitting: *Deleted

## 2015-12-30 ENCOUNTER — Encounter: Payer: Self-pay | Admitting: Radiation Oncology

## 2015-12-30 ENCOUNTER — Ambulatory Visit
Admission: RE | Admit: 2015-12-30 | Discharge: 2015-12-30 | Disposition: A | Discharge status: Home / Self Care | Payer: Medicare Other | Source: Ambulatory Visit | Attending: Radiation Oncology | Admitting: Radiation Oncology

## 2015-12-30 VITALS — BP 175/87 | HR 74 | Temp 98.1°F | Resp 17 | Ht 63.5 in | Wt 161.1 lb

## 2015-12-30 VITALS — BP 175/87 | HR 74 | Temp 98.1°F | Resp 17 | Ht 63.5 in | Wt 159.2 lb

## 2015-12-30 DIAGNOSIS — R152 Fecal urgency: Secondary | ICD-10-CM | POA: Diagnosis not present

## 2015-12-30 DIAGNOSIS — C20 Malignant neoplasm of rectum: Secondary | ICD-10-CM | POA: Diagnosis present

## 2015-12-30 DIAGNOSIS — I10 Essential (primary) hypertension: Secondary | ICD-10-CM | POA: Insufficient documentation

## 2015-12-30 DIAGNOSIS — Z8542 Personal history of malignant neoplasm of other parts of uterus: Secondary | ICD-10-CM

## 2015-12-30 LAB — CBC WITH DIFFERENTIAL/PLATELET
BASO%: 0.6 % (ref 0.0–2.0)
BASOS ABS: 0 10*3/uL (ref 0.0–0.1)
EOS%: 2.1 % (ref 0.0–7.0)
Eosinophils Absolute: 0.1 10*3/uL (ref 0.0–0.5)
HEMATOCRIT: 38.3 % (ref 34.8–46.6)
HGB: 12.5 g/dL (ref 11.6–15.9)
LYMPH#: 1.3 10*3/uL (ref 0.9–3.3)
LYMPH%: 20.7 % (ref 14.0–49.7)
MCH: 30.6 pg (ref 25.1–34.0)
MCHC: 32.6 g/dL (ref 31.5–36.0)
MCV: 93.6 fL (ref 79.5–101.0)
MONO#: 0.8 10*3/uL (ref 0.1–0.9)
MONO%: 12 % (ref 0.0–14.0)
NEUT#: 4.1 10*3/uL (ref 1.5–6.5)
NEUT%: 64.6 % (ref 38.4–76.8)
PLATELETS: 210 10*3/uL (ref 145–400)
RBC: 4.09 10*6/uL (ref 3.70–5.45)
RDW: 13.4 % (ref 11.2–14.5)
WBC: 6.3 10*3/uL (ref 3.9–10.3)

## 2015-12-30 MED ORDER — SONAFINE EX EMUL: 1.0000 "application " | Freq: Two times a day (BID) | CUTANEOUS | Status: DC

## 2015-12-30 NOTE — Telephone Encounter (Signed)
CALLED PATIENT TO INFORM OF FU APPT. WITH ALISON PERKINS ON 10-19-16 @ 10:45 AM, SPOKE WITH PATIENT'S DAUGHTER GERI AND SHE IS AWARE OF THIS APPT.

## 2015-12-30 NOTE — Progress Notes (Signed)
  [facility] OFFICE PROGRESS NOTE   Diagnosis: Rectal cancer  INTERVAL HISTORY:   Stephanie Burns returns as scheduled. She completed palliative radiation to the rectal tumor on 11/17/2015. She reports the rectal bleeding has almost completely resolved. No pain. She feels well.  Objective:  Vital signs in last 24 hours:  Blood pressure (!) 175/87, pulse 74, temperature 98.1 F (36.7 C), temperature source Oral, resp. rate 17, height 5' 3.5" (1.613 m), weight 161 lb 1.6 oz (73.1 kg), SpO2 99 %.   Resp: Lungs clear bilaterally Cardio: Regular rate and rhythm GI: No hepatomegaly, no mass, nontender, left lower quadrant colostomy Vascular: Trace edema at the left greater than right lower leg   Lab Results:  Lab Results  Component Value Date   WBC 6.3 12/30/2015   HGB 12.5 12/30/2015   HCT 38.3 12/30/2015   MCV 93.6 12/30/2015   PLT 210 12/30/2015   NEUTROABS 4.1 12/30/2015    wed the patient's current medications.  Assessment/Plan: 1. Rectal cancer, clinical stage II (uT3, uN0).  Microsatellite stable, no loss of mismatch repair protein expression.   Trial of Xeloda 1 week on/1 week off beginning around 01/17/2013.   CEA improved 03/06/2013.  Xeloda continued until approximately May of 2015 when she developed a bowel obstruction requiring a diverting colostomy  Initiation of palliative radiation 11/04/2015; completion of palliative radiation 11/17/2015 2. Rectal pressure/urgency secondary to #1. 3. Remote history of uterine cancer treated with surgery and adjuvant radiation. 4. History of Early hand-foot syndrome manifesting with erythema over the palms and soles. 5. Bleeding secondary to progression of rectal/vaginal tumor-improved following palliative radiation  Disposition:  She appears stable. Stephanie Burns will return for an office visit in 2 months. She will contact us in the interim for new symptoms.  [name], MD  12/30/2015  4:35  PM

## 2015-12-30 NOTE — Progress Notes (Signed)
  Radiation Oncology         [phone] ________________________________  Name: Stephanie Burns MRN: [id number]  Date: 12/30/2015  DOB: 1918-10-22  Post Treatment Note  CC: Luellen Pucker, MD  Milus Banister, MD  Diagnosis:   Recurrent Stage II, T3, N0 adenocarcinoma of the rectum.   Interval Since Last Radiation:  6 weeks   11/04/15 - 11/17/15:  30 Gy in 10 fractions to the pelvis  Narrative:  The patient returns today for routine follow-up.  Prior to and early on the course of her radiotherapy treatment, she did have rectal bleeding, which significantly improved since her last visit. She reports she is not having any bleeding currently, but occasionally notes discharge from the rectum. She denies any abdominal pain, nausea, vomiting, chest pain, or shortness of breath. She reports urgency or rectal pressure at times. No other complaints are verbalized.   ALLERGIES:  has No Known Allergies.  Meds: Current Outpatient Prescriptions  Medication Sig Dispense Refill  . Cholecalciferol (VITAMIN D-3) 1000 UNITS CAPS Take 1,000 Units by mouth daily.    . vitamin B-12 (CYANOCOBALAMIN) 1000 MCG tablet Take 1,000 mcg by mouth daily.     No current facility-administered medications for this encounter.     Physical Findings:  height is 5' 3.5" (1.613 m) and weight is 159 lb 3.2 oz (72.2 kg). Her oral temperature is 98.1 F (36.7 C). Her blood pressure is 175/87 (abnormal) and her pulse is 74. Her respiration is 17 and oxygen saturation is 98%.  In general this is a well appearing caucasian female in no acute distress. She's alert and oriented x4 and appropriate throughout the examination. Cardiopulmonary assessment is negative for acute distress and she exhibits normal effort.   Lab Findings: Lab Results  Component Value Date   WBC 6.3 12/30/2015   HGB 12.5 12/30/2015   HCT 38.3 12/30/2015   MCV 93.6 12/30/2015   PLT 210 12/30/2015     Radiographic Findings: No results  found.  Impression/Plan: 1. Recurrent Stage II, T3, N0 adenocarcinoma of the rectum. The patient appears to be doing well. She anticipates following up with Dr. Learta Codding in January 2018, and I would like to see her back in 6 month's or PRN. She states she will be back in [place] in the summer, and would like to postpone this until later in the year. We will plan to see her around mid September, and she will contact us sooner if she has questions or concerns prior to that visit. 2. Possible genetic predisposition to malignancy. I did discuss that given her personal history of uterine cancer and colorectal cancer, it would be reasonable to consider meeting with genetic counseling to discuss whether or not there is any utility in testing for [facility] syndrome. She is not interested in this at this time, but will let me know if she changes her mind. 3. HTN. The patient is asymptomatic, and will follow up with her PCP for this. She attributes this to nervousness. We will follow this expectantly.    Carola Rhine, PAC

## 2015-12-30 NOTE — Telephone Encounter (Signed)
Appointments scheduled per 12/30/15 los. A copy of the AVS report and appointment schedule was given to patient, per 12/30/15 los. °

## 2016-02-12 DIAGNOSIS — C20 Malignant neoplasm of rectum: Secondary | ICD-10-CM | POA: Diagnosis not present

## 2016-02-12 DIAGNOSIS — Z433 Encounter for attention to colostomy: Secondary | ICD-10-CM | POA: Diagnosis not present

## 2016-03-08 ENCOUNTER — Telehealth: Payer: Self-pay | Admitting: Oncology

## 2016-03-08 ENCOUNTER — Ambulatory Visit (HOSPITAL_BASED_OUTPATIENT_CLINIC_OR_DEPARTMENT_OTHER): Payer: Medicare HMO | Admitting: Oncology

## 2016-03-08 VITALS — BP 194/79 | HR 84 | Temp 97.9°F | Resp 17 | Ht 63.5 in | Wt 161.7 lb

## 2016-03-08 DIAGNOSIS — R152 Fecal urgency: Secondary | ICD-10-CM

## 2016-03-08 DIAGNOSIS — Z8542 Personal history of malignant neoplasm of other parts of uterus: Secondary | ICD-10-CM

## 2016-03-08 DIAGNOSIS — Z933 Colostomy status: Secondary | ICD-10-CM | POA: Diagnosis not present

## 2016-03-08 DIAGNOSIS — C20 Malignant neoplasm of rectum: Secondary | ICD-10-CM

## 2016-03-08 NOTE — Telephone Encounter (Signed)
Appointments scheduled per 2/5 LOS. Patient given AVS report and calendars with future scheduled appointments. °

## 2016-03-08 NOTE — Progress Notes (Signed)
  Stephanie Burns   Diagnosis: Rectal cancer  INTERVAL HISTORY:   Stephanie Burns returns as scheduled. She feels well. No pain. No rectal bleeding. Colostomy is functioning well. Her daughter reports her blood pressure is lower when checked at home. Objective:  Vital signs in last 24 hours:  Blood pressure (!) 194/79, pulse 84, temperature 97.9 F (36.6 C), temperature source Oral, resp. rate 17, height 5' 3.5" (1.613 m), weight 161 lb 11.2 oz (73.3 kg), SpO2 98 %.    HEENT:  Neck without mass Lymphatics:  No cervical, supraclavicular, or inguinal nodes Resp:  Lungs clear bilaterally Cardio:  Regular rate and rhythm, 2/6 systolic murmur GI:  No hepatomegaly, left lower quadrant colostomy, no mass, nontender Vascular:  Trace edema at the left greater than right lower leg   Medications: I have reviewed the patient's current medications.  Assessment/Plan: 1. Rectal cancer, clinical stage II (uT3, uN0).  Microsatellite stable, no loss of mismatch repair protein expression.   Trial of Xeloda 1 week on/1 week off beginning around 01/17/2013.   CEA improved 03/06/2013.  Xeloda continued until approximately May of 2015 when she developed a bowel obstruction requiring a diverting colostomy  Initiation of palliative radiation 11/04/2015; completion of palliative radiation 11/17/2015 2. Rectal pressure/urgency secondary to #1. 3. Remote history of uterine cancer treated with surgery and adjuvant radiation. 4. History of Early hand-foot syndrome manifesting with erythema over the palms and soles. 5. Bleeding secondary to progression of rectal/vaginal tumor-improved following palliative radiation    Disposition:  She appears stable. No clinical evidence for progression of the rectal cancer. She will contact us for rectal bleeding or pain. Stephanie Burns will return to [place] in May. She will return for an office visit here during the last week of  April.  15 minutes were spent with the patient today. The majority of the time was used for counseling and coordination of care.  Stephanie Coder, MD  03/08/2016  12:53 PM

## 2016-03-15 DIAGNOSIS — Z433 Encounter for attention to colostomy: Secondary | ICD-10-CM | POA: Diagnosis not present

## 2016-03-15 DIAGNOSIS — C20 Malignant neoplasm of rectum: Secondary | ICD-10-CM | POA: Diagnosis not present

## 2016-04-15 DIAGNOSIS — Z433 Encounter for attention to colostomy: Secondary | ICD-10-CM | POA: Diagnosis not present

## 2016-04-15 DIAGNOSIS — C20 Malignant neoplasm of rectum: Secondary | ICD-10-CM | POA: Diagnosis not present

## 2016-05-04 ENCOUNTER — Telehealth: Payer: Self-pay | Admitting: *Deleted

## 2016-05-04 NOTE — Telephone Encounter (Signed)
Message received from patient requesting a return call from Dr. Gearldine Shown nurse.  Call placed back to patient and patient states that she has had 3-4 episodes of small amounts of vaginal bleeding, the last one being approximately one week ago.  She denies any pain, fever, dizziness, SOB and is eating and drinking without difficulty.  She would like to know if she needs to come in prior to her scheduled appt on 05/24/16.  Instructed pt that I would inform Dr. Benay Spice of her symptoms and call her back with MD orders.  Patient appreciative of call back.

## 2016-05-04 NOTE — Telephone Encounter (Signed)
Message received from patient regarding a "question" she has and requesting a call back.  Call placed back to patient and voicemail received.  Message left for patient to call [place] back with her question.

## 2016-05-04 NOTE — Telephone Encounter (Signed)
Call placed back to patient to inform her per order of Dr. Benay Spice that there is no need to come in for earlier appt and for her to keep her appt on 05/24/16 as currently scheduled.  Patient appreciative of call back and voiced understanding to call [place] back with any other concerns or bleeding.

## 2016-05-19 DIAGNOSIS — C20 Malignant neoplasm of rectum: Secondary | ICD-10-CM | POA: Diagnosis not present

## 2016-05-19 DIAGNOSIS — Z433 Encounter for attention to colostomy: Secondary | ICD-10-CM | POA: Diagnosis not present

## 2016-05-24 ENCOUNTER — Telehealth: Payer: Self-pay | Admitting: Oncology

## 2016-05-24 ENCOUNTER — Ambulatory Visit (HOSPITAL_BASED_OUTPATIENT_CLINIC_OR_DEPARTMENT_OTHER): Payer: Medicare HMO | Admitting: Nurse Practitioner

## 2016-05-24 VITALS — BP 190/78 | HR 82 | Temp 98.0°F | Resp 18 | Wt 160.6 lb

## 2016-05-24 DIAGNOSIS — C20 Malignant neoplasm of rectum: Secondary | ICD-10-CM | POA: Diagnosis not present

## 2016-05-24 DIAGNOSIS — Z933 Colostomy status: Secondary | ICD-10-CM | POA: Diagnosis not present

## 2016-05-24 DIAGNOSIS — Z8542 Personal history of malignant neoplasm of other parts of uterus: Secondary | ICD-10-CM | POA: Diagnosis not present

## 2016-05-24 NOTE — Telephone Encounter (Signed)
Appointments scheduled per 05/24/16 los. Patient was given a copy of the AVS report and appointment schedule per 05/24/16 los.  °

## 2016-05-24 NOTE — Progress Notes (Addendum)
  [facility] OFFICE PROGRESS NOTE   Diagnosis:  Rectal cancer  INTERVAL HISTORY:   Stephanie Burns returns as scheduled. She feels well. She reports a good appetite. She is gaining weight. She denies pain. Colostomy is functioning normally. About a month ago she had 3 or 4 episodes of a small amount of vaginal bleeding occurring over a 1 week timeframe. No bleeding for the past 3 weeks.  Objective:  Vital signs in last 24 hours:  Blood pressure (!) 190/78, pulse 82, temperature 98 F (36.7 C), temperature source Oral, resp. rate 18, weight 160 lb 9.6 oz (72.8 kg), SpO2 100 %.    HEENT: Neck without mass. Lymphatics: No palpable inguinal lymph nodes. Resp: Lungs clear bilaterally. Cardio: Regular rate and rhythm. GI: Abdomen soft and nontender. No hepatomegaly. Left lower quadrant colostomy. Vascular: Trace to 1+ bilateral lower leg edema left greater than right.  Lab Results:  Lab Results  Component Value Date   WBC 6.3 12/30/2015   HGB 12.5 12/30/2015   HCT 38.3 12/30/2015   MCV 93.6 12/30/2015   PLT 210 12/30/2015   NEUTROABS 4.1 12/30/2015    Imaging:  No results found.  Medications: I have reviewed the patient's current medications.  Assessment/Plan: 1. Rectal cancer, clinical stage II (uT3, uN0).  Microsatellite stable, no loss of mismatch repair protein expression.   Trial of Xeloda 1 week on/1 week off beginning around 01/17/2013.   CEA improved 03/06/2013.  Xeloda continued until approximately May of 2015 when she developed a bowel obstruction requiring a diverting colostomy  Initiation of palliative radiation 11/04/2015; completion of palliative radiation 11/17/2015 2. Rectal pressure/urgency secondary to #1. 3. Remote history of uterine cancer treated with surgery and adjuvant radiation. 4. History of Early hand-foot syndrome manifesting with erythema over the palms and soles. 5. Bleeding secondary to progression of rectal/vaginal  tumor-improved following palliative radiation   Disposition: Stephanie Burns appears stable. She had several episodes of vaginal bleeding 3-4 weeks ago, none since. She understands this may be related to progression of the rectal tumor. If the bleeding recurs she will seek evaluation. She is leaving for [place] next week with plans to return mid-September. She is established with a physician in [place]. We will see her in follow-up in late September/early October.   Patient seen with Dr. Benay Spice.    Ned Card ANP/GNP-BC   05/24/2016  11:40 AM This was a shared visit with Ned Card. Stephanie Burns appears stable. She will follow-up with her physician in [place] for persistent vaginal bleeding. The bleeding could be related to the rectal tumor.  She will return for an office visit here when she returns from [place].  Julieanne Manson, M.D.

## 2016-06-22 DIAGNOSIS — H353131 Nonexudative age-related macular degeneration, bilateral, early dry stage: Secondary | ICD-10-CM | POA: Diagnosis not present

## 2016-07-14 DIAGNOSIS — Z433 Encounter for attention to colostomy: Secondary | ICD-10-CM | POA: Diagnosis not present

## 2016-07-14 DIAGNOSIS — C20 Malignant neoplasm of rectum: Secondary | ICD-10-CM | POA: Diagnosis not present

## 2016-07-31 DIAGNOSIS — E538 Deficiency of other specified B group vitamins: Secondary | ICD-10-CM | POA: Diagnosis not present

## 2016-07-31 DIAGNOSIS — R609 Edema, unspecified: Secondary | ICD-10-CM | POA: Diagnosis not present

## 2016-07-31 DIAGNOSIS — E559 Vitamin D deficiency, unspecified: Secondary | ICD-10-CM | POA: Diagnosis not present

## 2016-07-31 DIAGNOSIS — E7800 Pure hypercholesterolemia, unspecified: Secondary | ICD-10-CM | POA: Diagnosis not present

## 2016-07-31 DIAGNOSIS — C541 Malignant neoplasm of endometrium: Secondary | ICD-10-CM | POA: Diagnosis not present

## 2016-08-03 DIAGNOSIS — E538 Deficiency of other specified B group vitamins: Secondary | ICD-10-CM | POA: Diagnosis not present

## 2016-08-03 DIAGNOSIS — E7800 Pure hypercholesterolemia, unspecified: Secondary | ICD-10-CM | POA: Diagnosis not present

## 2016-08-03 DIAGNOSIS — E559 Vitamin D deficiency, unspecified: Secondary | ICD-10-CM | POA: Diagnosis not present

## 2016-08-03 DIAGNOSIS — R609 Edema, unspecified: Secondary | ICD-10-CM | POA: Diagnosis not present

## 2016-08-23 DIAGNOSIS — C20 Malignant neoplasm of rectum: Secondary | ICD-10-CM | POA: Diagnosis not present

## 2016-08-23 DIAGNOSIS — Z433 Encounter for attention to colostomy: Secondary | ICD-10-CM | POA: Diagnosis not present

## 2016-09-20 DIAGNOSIS — M1711 Unilateral primary osteoarthritis, right knee: Secondary | ICD-10-CM | POA: Diagnosis not present

## 2016-09-20 DIAGNOSIS — M85861 Other specified disorders of bone density and structure, right lower leg: Secondary | ICD-10-CM | POA: Diagnosis not present

## 2016-09-20 DIAGNOSIS — M549 Dorsalgia, unspecified: Secondary | ICD-10-CM | POA: Diagnosis not present

## 2016-09-20 DIAGNOSIS — M25561 Pain in right knee: Secondary | ICD-10-CM | POA: Diagnosis not present

## 2016-09-23 DIAGNOSIS — E538 Deficiency of other specified B group vitamins: Secondary | ICD-10-CM | POA: Diagnosis not present

## 2016-09-23 DIAGNOSIS — M25561 Pain in right knee: Secondary | ICD-10-CM | POA: Diagnosis not present

## 2016-09-23 DIAGNOSIS — E559 Vitamin D deficiency, unspecified: Secondary | ICD-10-CM | POA: Diagnosis not present

## 2016-09-23 DIAGNOSIS — R609 Edema, unspecified: Secondary | ICD-10-CM | POA: Diagnosis not present

## 2016-10-01 DIAGNOSIS — C20 Malignant neoplasm of rectum: Secondary | ICD-10-CM | POA: Diagnosis not present

## 2016-10-01 DIAGNOSIS — Z433 Encounter for attention to colostomy: Secondary | ICD-10-CM | POA: Diagnosis not present

## 2016-10-19 ENCOUNTER — Ambulatory Visit
Admission: RE | Admit: 2016-10-19 | Discharge: 2016-10-19 | Disposition: A | Discharge status: Home / Self Care | Payer: Medicare HMO | Source: Ambulatory Visit | Attending: Radiation Oncology | Admitting: Radiation Oncology

## 2016-10-19 ENCOUNTER — Encounter: Payer: Self-pay | Admitting: *Deleted

## 2016-10-19 NOTE — Progress Notes (Signed)
Stephanie Burns with daughter Stephanie Burns and she reported that her mother will not be coming to any more appointments with Dr. Lisbeth Renshaw in Radiation oncology; she will only keep her appointments with Dr. Johnanna Schneiders her Medical oncologist. I let Mrs. Ewalden know that I would pass this information on to Dr. Lisbeth Renshaw and Shona Simpson, P.A.

## 2016-10-28 ENCOUNTER — Ambulatory Visit (HOSPITAL_BASED_OUTPATIENT_CLINIC_OR_DEPARTMENT_OTHER): Payer: Medicare HMO | Admitting: Oncology

## 2016-10-28 ENCOUNTER — Telehealth: Payer: Self-pay | Admitting: Oncology

## 2016-10-28 VITALS — BP 141/77 | HR 65 | Temp 97.0°F | Resp 16 | Ht 63.5 in | Wt 159.5 lb

## 2016-10-28 DIAGNOSIS — M25461 Effusion, right knee: Secondary | ICD-10-CM | POA: Diagnosis not present

## 2016-10-28 DIAGNOSIS — C20 Malignant neoplasm of rectum: Secondary | ICD-10-CM | POA: Diagnosis not present

## 2016-10-28 NOTE — Telephone Encounter (Signed)
Gave patient AVS and calendar of upcoming January 2019 appointment.  °

## 2016-10-28 NOTE — Progress Notes (Signed)
  [facility] OFFICE PROGRESS NOTE   Diagnosis: Rectal cancer  INTERVAL HISTORY:   Stephanie Burns returns as scheduled. She injured the right knee when getting off of a hotel toilet in May. She has pain in the right knee when standing. The right knee remains swollen. She otherwise feels well. No difficulty with the colostomy. No rectal bleeding. Good appetite.  Objective:  Vital signs in last 24 hours:  Blood pressure (!) 141/77, pulse 65, temperature (!) 97 F (36.1 C), temperature source Oral, resp. rate 16, height 5' 3.5" (1.613 m), weight 159 lb 8 oz (72.3 kg), SpO2 100 %.    Lymphatics: No inguinal nodes Resp: Lungs clear bilaterally Cardio: Irregular, 2/6 systolic murmur GI: No hepatosplenomegaly, left lower quadrant colostomy, nontender Vascular: Trace left greater than right lobe pretibial/ankle edema Musculoskeletal: There is swelling of the right knee with mild warmth. There appears to be a patellar effusion. Mild valgus deformity at the right knee    Medications: I have reviewed the patient's current medications.  Assessment/Plan: 1. Rectal cancer, clinical stage II (uT3, uN0).  Microsatellite stable, no loss of mismatch repair protein expression.   Trial of Xeloda 1 week on/1 week off beginning around 01/17/2013.   CEA improved 03/06/2013.  Xeloda continued until approximately May of 2015 when she developed a bowel obstruction requiring a diverting colostomy  Initiation of palliative radiation 11/04/2015; completion of palliative radiation 11/17/2015 2. Rectal pressure/urgency secondary to #1. 3. Remote history of uterine cancer treated with surgery and adjuvant radiation. 4. History of Early hand-foot syndrome manifesting with erythema over the palms and soles. 5. Bleeding secondary to progression of rectal/vaginal tumor-improved following palliative radiation  Disposition:  Stephanie Burns has a history of locally recurrent rectal cancer. There  is no clinical evidence for progression of the rectal cancer.  She appears to have a right knee effusion, potentially related to arthritis or an injury. We will make an orthopedic referral.  She will obtain an influenza vaccine.  Stephanie Burns will return for an office visit in 3 months.  Donneta Romberg, MD  10/28/2016  12:17 PM

## 2016-11-01 DIAGNOSIS — M1711 Unilateral primary osteoarthritis, right knee: Secondary | ICD-10-CM | POA: Diagnosis not present

## 2016-11-02 DIAGNOSIS — R69 Illness, unspecified: Secondary | ICD-10-CM | POA: Diagnosis not present

## 2016-11-09 DIAGNOSIS — C20 Malignant neoplasm of rectum: Secondary | ICD-10-CM | POA: Diagnosis not present

## 2016-11-09 DIAGNOSIS — Z433 Encounter for attention to colostomy: Secondary | ICD-10-CM | POA: Diagnosis not present

## 2016-12-06 ENCOUNTER — Telehealth: Payer: Self-pay | Admitting: Oncology

## 2016-12-06 NOTE — Telephone Encounter (Signed)
Faxed office notes to [facility] risk adjustment [phone]

## 2016-12-13 DIAGNOSIS — M1711 Unilateral primary osteoarthritis, right knee: Secondary | ICD-10-CM | POA: Diagnosis not present

## 2016-12-17 DIAGNOSIS — Z433 Encounter for attention to colostomy: Secondary | ICD-10-CM | POA: Diagnosis not present

## 2016-12-17 DIAGNOSIS — C20 Malignant neoplasm of rectum: Secondary | ICD-10-CM | POA: Diagnosis not present

## 2017-01-13 DIAGNOSIS — M1711 Unilateral primary osteoarthritis, right knee: Secondary | ICD-10-CM | POA: Diagnosis not present

## 2017-01-19 DIAGNOSIS — Z433 Encounter for attention to colostomy: Secondary | ICD-10-CM | POA: Diagnosis not present

## 2017-01-19 DIAGNOSIS — C20 Malignant neoplasm of rectum: Secondary | ICD-10-CM | POA: Diagnosis not present

## 2017-01-20 DIAGNOSIS — M1711 Unilateral primary osteoarthritis, right knee: Secondary | ICD-10-CM | POA: Diagnosis not present

## 2017-01-31 DIAGNOSIS — M1711 Unilateral primary osteoarthritis, right knee: Secondary | ICD-10-CM | POA: Diagnosis not present

## 2017-02-07 ENCOUNTER — Encounter: Payer: Self-pay | Admitting: Nurse Practitioner

## 2017-02-07 ENCOUNTER — Telehealth: Payer: Self-pay | Admitting: Oncology

## 2017-02-07 ENCOUNTER — Inpatient Hospital Stay: Payer: Medicare HMO | Attending: Nurse Practitioner | Admitting: Nurse Practitioner

## 2017-02-07 VITALS — BP 180/86 | HR 64 | Temp 97.7°F | Resp 16 | Ht 63.5 in | Wt 161.3 lb

## 2017-02-07 DIAGNOSIS — C20 Malignant neoplasm of rectum: Secondary | ICD-10-CM | POA: Insufficient documentation

## 2017-02-07 DIAGNOSIS — Z8542 Personal history of malignant neoplasm of other parts of uterus: Secondary | ICD-10-CM | POA: Insufficient documentation

## 2017-02-07 DIAGNOSIS — Z923 Personal history of irradiation: Secondary | ICD-10-CM | POA: Insufficient documentation

## 2017-02-07 NOTE — Progress Notes (Signed)
  [employer] OFFICE PROGRESS NOTE   Diagnosis: Rectal cancer  INTERVAL HISTORY:   [name] returns as scheduled.  Ostomy is functioning normally.  No rectal bleeding or pain.  She has a good appetite.  She has intermittent right knee pain related to activity level.  Objective:  Vital signs in last 24 hours:  Blood pressure (!) 180/86, pulse 64, temperature 97.7 F (36.5 C), temperature source Oral, resp. rate 16, height 5' 3.5" (1.613 m), weight 161 lb 4.8 oz (73.2 kg), SpO2 100 %.    HEENT: Neck without mass. Lymphatics: No palpable cervical, supraclavicular, axillary or inguinal lymph nodes. Resp: Lungs clear bilaterally. Cardio: Irregular, 2/6 systolic murmur. GI: Abdomen soft and nontender.  No hepatomegaly.  Left lower quadrant colostomy. Vascular: Trace edema at the lower legs bilaterally left greater than right.  Lab Results:  Lab Results  Component Value Date   WBC 6.3 12/30/2015   HGB 12.5 12/30/2015   HCT 38.3 12/30/2015   MCV 93.6 12/30/2015   PLT 210 12/30/2015   NEUTROABS 4.1 12/30/2015    Imaging:  No results found.  Medications: I have reviewed the patient's current medications.  Assessment/Plan: 1. Rectal cancer, clinical stage II (uT3, uN0).  Microsatellite stable, no loss of mismatch repair protein expression.   Trial of Xeloda 1 week on/1 week off beginning around 01/17/2013.   CEA improved 03/06/2013.  Xeloda continued until approximately May of 2015 when she developed a bowel obstruction requiring a diverting colostomy  Initiation of palliative radiation 11/04/2015; completion of palliative radiation 11/17/2015 2. Rectal pressure/urgency secondary to #1. 3. Remote history of uterine cancer treated with surgery and adjuvant radiation. 4. History of Early hand-foot syndrome manifesting with erythema over the palms and soles. 5. Bleeding secondary to progression of rectal/vaginal tumor-improved following palliative  radiation     Disposition: Stephanie Burns appears stable.  There is no clinical evidence for progression of the rectal cancer.  She will return for a follow-up visit in 3-4 months.  She will contact the office in the interim with any problems.    Ned Card ANP/GNP-BC   02/07/2017  11:01 AM

## 2017-02-07 NOTE — Telephone Encounter (Signed)
Gave avs and calendar for april °

## 2017-02-24 DIAGNOSIS — C20 Malignant neoplasm of rectum: Secondary | ICD-10-CM | POA: Diagnosis not present

## 2017-02-24 DIAGNOSIS — Z433 Encounter for attention to colostomy: Secondary | ICD-10-CM | POA: Diagnosis not present

## 2017-02-28 DIAGNOSIS — M1711 Unilateral primary osteoarthritis, right knee: Secondary | ICD-10-CM | POA: Diagnosis not present

## 2017-04-02 DIAGNOSIS — C20 Malignant neoplasm of rectum: Secondary | ICD-10-CM | POA: Diagnosis not present

## 2017-04-02 DIAGNOSIS — Z433 Encounter for attention to colostomy: Secondary | ICD-10-CM | POA: Diagnosis not present

## 2017-05-07 DIAGNOSIS — C20 Malignant neoplasm of rectum: Secondary | ICD-10-CM | POA: Diagnosis not present

## 2017-05-07 DIAGNOSIS — Z433 Encounter for attention to colostomy: Secondary | ICD-10-CM | POA: Diagnosis not present

## 2017-05-09 ENCOUNTER — Inpatient Hospital Stay: Payer: Medicare HMO | Attending: Nurse Practitioner | Admitting: Oncology

## 2017-05-09 ENCOUNTER — Telehealth: Payer: Self-pay | Admitting: Oncology

## 2017-05-09 VITALS — BP 174/89 | HR 93 | Temp 97.7°F | Resp 15 | Ht 63.5 in | Wt 156.7 lb

## 2017-05-09 DIAGNOSIS — Z8542 Personal history of malignant neoplasm of other parts of uterus: Secondary | ICD-10-CM | POA: Diagnosis not present

## 2017-05-09 DIAGNOSIS — Z923 Personal history of irradiation: Secondary | ICD-10-CM | POA: Insufficient documentation

## 2017-05-09 DIAGNOSIS — C20 Malignant neoplasm of rectum: Secondary | ICD-10-CM | POA: Insufficient documentation

## 2017-05-09 NOTE — Telephone Encounter (Signed)
Scheduled appt per 4/8 los - Gave patient AVS and calender per los.  

## 2017-05-09 NOTE — Progress Notes (Signed)
  [facility] OFFICE PROGRESS NOTE   Diagnosis: Rectal cancer  INTERVAL HISTORY:   Ms. Burns returns as scheduled.  She feels well.  Good appetite.  No rectal pain or bleeding.  She recently had a "cold ".  She plans to relocate to [place] for the summer beginning in mid May.  Objective:  Vital signs in last 24 hours:  Blood pressure (!) 174/89, pulse 93, temperature 97.7 F (36.5 C), temperature source Oral, resp. rate 15, height 5' 3.5" (1.613 m), weight 156 lb 11.2 oz (71.1 kg), SpO2 98 %.    Lymphatics: No inguinal nodes Resp: Lungs clear bilaterally, no respiratory distress Cardio: Regular rate and rhythm GI: No hepatomegaly, no mass, left lower quadrant colostomy, nontender Vascular: Trace edema at the left greater than right lower leg and ankle   Medications: I have reviewed the patient's current medications.   Assessment/Plan: 1. Rectal cancer, clinical stage II (uT3, uN0).  Microsatellite stable, no loss of mismatch repair protein expression.   Trial of Xeloda 1 week on/1 week off beginning around 01/17/2013.   CEA improved 03/06/2013.  Xeloda continued until approximately May of 2015 when she developed a bowel obstruction requiring a diverting colostomy  Initiation of palliative radiation 11/04/2015; completion of palliative radiation 11/17/2015 2. Rectal pressure/urgency secondary to #1. 3. Remote history of uterine cancer treated with surgery and adjuvant radiation. 4. History of Early hand-foot syndrome manifesting with erythema over the palms and soles. 5. Bleeding secondary to progression of rectal/vaginal tumor-improved following palliative radiation   Disposition: Stephanie Burns appears unchanged.  There is no clinical evidence for progression of rectal cancer.  We will continue following her with observation.  She will contact [place] for new symptoms. She will travel to [place] for the summer beginning next month.  She will return for an  office visit here after she returns from [place] in late September.  She will contact us in the interim as needed.  15 minutes were spent with the patient today.  The majority of the time was used for counseling and coordination of care.  [name], MD  05/09/2017  10:27 AM

## 2017-06-28 DIAGNOSIS — H353131 Nonexudative age-related macular degeneration, bilateral, early dry stage: Secondary | ICD-10-CM | POA: Diagnosis not present

## 2017-07-06 DIAGNOSIS — C20 Malignant neoplasm of rectum: Secondary | ICD-10-CM | POA: Diagnosis not present

## 2017-07-06 DIAGNOSIS — Z433 Encounter for attention to colostomy: Secondary | ICD-10-CM | POA: Diagnosis not present

## 2017-07-20 DIAGNOSIS — E538 Deficiency of other specified B group vitamins: Secondary | ICD-10-CM | POA: Diagnosis not present

## 2017-07-20 DIAGNOSIS — C20 Malignant neoplasm of rectum: Secondary | ICD-10-CM | POA: Diagnosis not present

## 2017-07-20 DIAGNOSIS — R609 Edema, unspecified: Secondary | ICD-10-CM | POA: Diagnosis not present

## 2017-07-20 DIAGNOSIS — E559 Vitamin D deficiency, unspecified: Secondary | ICD-10-CM | POA: Diagnosis not present

## 2017-07-20 DIAGNOSIS — E7800 Pure hypercholesterolemia, unspecified: Secondary | ICD-10-CM | POA: Diagnosis not present

## 2017-08-03 DIAGNOSIS — R609 Edema, unspecified: Secondary | ICD-10-CM | POA: Diagnosis not present

## 2017-08-03 DIAGNOSIS — E538 Deficiency of other specified B group vitamins: Secondary | ICD-10-CM | POA: Diagnosis not present

## 2017-08-03 DIAGNOSIS — E7800 Pure hypercholesterolemia, unspecified: Secondary | ICD-10-CM | POA: Diagnosis not present

## 2017-08-03 DIAGNOSIS — C20 Malignant neoplasm of rectum: Secondary | ICD-10-CM | POA: Diagnosis not present

## 2017-08-09 DIAGNOSIS — C20 Malignant neoplasm of rectum: Secondary | ICD-10-CM | POA: Diagnosis not present

## 2017-08-09 DIAGNOSIS — Z433 Encounter for attention to colostomy: Secondary | ICD-10-CM | POA: Diagnosis not present

## 2017-08-18 DIAGNOSIS — M25561 Pain in right knee: Secondary | ICD-10-CM | POA: Diagnosis not present

## 2017-08-18 DIAGNOSIS — M545 Low back pain: Secondary | ICD-10-CM | POA: Diagnosis not present

## 2017-09-14 DIAGNOSIS — C20 Malignant neoplasm of rectum: Secondary | ICD-10-CM | POA: Diagnosis not present

## 2017-09-14 DIAGNOSIS — Z433 Encounter for attention to colostomy: Secondary | ICD-10-CM | POA: Diagnosis not present

## 2017-10-19 DIAGNOSIS — C20 Malignant neoplasm of rectum: Secondary | ICD-10-CM | POA: Diagnosis not present

## 2017-10-19 DIAGNOSIS — Z433 Encounter for attention to colostomy: Secondary | ICD-10-CM | POA: Diagnosis not present

## 2017-10-31 ENCOUNTER — Telehealth: Payer: Self-pay | Admitting: Oncology

## 2017-10-31 ENCOUNTER — Inpatient Hospital Stay: Payer: Medicare HMO | Attending: Oncology | Admitting: Oncology

## 2017-10-31 VITALS — BP 160/86 | HR 75 | Temp 97.9°F | Resp 18 | Ht 63.5 in | Wt 156.1 lb

## 2017-10-31 DIAGNOSIS — C20 Malignant neoplasm of rectum: Secondary | ICD-10-CM

## 2017-10-31 DIAGNOSIS — Z23 Encounter for immunization: Secondary | ICD-10-CM | POA: Insufficient documentation

## 2017-10-31 DIAGNOSIS — Z8542 Personal history of malignant neoplasm of other parts of uterus: Secondary | ICD-10-CM | POA: Diagnosis not present

## 2017-10-31 DIAGNOSIS — Z923 Personal history of irradiation: Secondary | ICD-10-CM

## 2017-10-31 MED ORDER — INFLUENZA VAC SPLIT HIGH-DOSE 0.5 ML IM SUSY
0.5000 mL | PREFILLED_SYRINGE | Freq: Once | INTRAMUSCULAR | Status: AC
  Administered 2017-10-31: 0.5 mL via INTRAMUSCULAR
  Filled 2017-10-31: qty 0.5

## 2017-10-31 NOTE — Progress Notes (Signed)
  [facility] OFFICE PROGRESS NOTE   Diagnosis: Rectal cancer  INTERVAL HISTORY:   Stephanie Burns returns as scheduled.  She returned from [place] earlier this month.  She feels well.  Good appetite.  No rectal or vaginal bleeding.  No pain.  Objective:  Vital signs in last 24 hours:  Blood pressure (!) 176/108, pulse 75, temperature 97.9 F (36.6 C), temperature source Oral, resp. rate 18, height 5' 3.5" (1.613 m), weight 156 lb 1.6 oz (70.8 kg), SpO2 99 %.    Lymphatics: No inguinal nodes Resp: Lungs clear bilaterally Cardio: Regular rate and rhythm GI: No hepatomegaly, left lower quadrant colostomy, nontender, no mass Vascular: Trace lower leg edema chronic stasis change bilaterally   Lab Results:   Medications: I have reviewed the patient's current medications.   Assessment/Plan: 1. Rectal cancer, clinical stage II (uT3, uN0).  Microsatellite stable, no loss of mismatch repair protein expression.   Trial of Xeloda 1 week on/1 week off beginning around 01/17/2013.   CEA improved 03/06/2013.  Xeloda continued until approximately May of 2015 when she developed a bowel obstruction requiring a diverting colostomy  Initiation of palliative radiation 11/04/2015; completion of palliative radiation 11/17/2015 2. Rectal pressure/urgency secondary to #1. 3. Remote history of uterine cancer treated with surgery and adjuvant radiation. 4. History of Early hand-foot syndrome manifesting with erythema over the palms and soles. 5. Bleeding secondary to progression of rectal/vaginal tumor-improved following palliative radiation    Disposition: Stephanie Burns appears unchanged.  There is no clinical evidence for progression of the rectal cancer.  She appears asymptomatic.  She received an influenza vaccine today.  She will return for an office visit in 4 months.  15 minutes were spent with the patient today.  The majority of the time was used for counseling and  coordination of care.  [name], MD  10/31/2017  11:36 AM

## 2017-10-31 NOTE — Telephone Encounter (Signed)
Appts scheduled avs/calendar printed per 9/30 los °

## 2017-11-18 DIAGNOSIS — Z433 Encounter for attention to colostomy: Secondary | ICD-10-CM | POA: Diagnosis not present

## 2017-11-18 DIAGNOSIS — C20 Malignant neoplasm of rectum: Secondary | ICD-10-CM | POA: Diagnosis not present

## 2017-11-19 DIAGNOSIS — C20 Malignant neoplasm of rectum: Secondary | ICD-10-CM | POA: Diagnosis not present

## 2017-11-19 DIAGNOSIS — Z433 Encounter for attention to colostomy: Secondary | ICD-10-CM | POA: Diagnosis not present

## 2017-11-20 DIAGNOSIS — Z433 Encounter for attention to colostomy: Secondary | ICD-10-CM | POA: Diagnosis not present

## 2017-11-20 DIAGNOSIS — C20 Malignant neoplasm of rectum: Secondary | ICD-10-CM | POA: Diagnosis not present

## 2017-11-21 DIAGNOSIS — Z433 Encounter for attention to colostomy: Secondary | ICD-10-CM | POA: Diagnosis not present

## 2017-11-21 DIAGNOSIS — C20 Malignant neoplasm of rectum: Secondary | ICD-10-CM | POA: Diagnosis not present

## 2017-11-22 DIAGNOSIS — Z433 Encounter for attention to colostomy: Secondary | ICD-10-CM | POA: Diagnosis not present

## 2017-11-22 DIAGNOSIS — C20 Malignant neoplasm of rectum: Secondary | ICD-10-CM | POA: Diagnosis not present

## 2017-11-23 DIAGNOSIS — C20 Malignant neoplasm of rectum: Secondary | ICD-10-CM | POA: Diagnosis not present

## 2017-11-23 DIAGNOSIS — Z433 Encounter for attention to colostomy: Secondary | ICD-10-CM | POA: Diagnosis not present

## 2017-11-24 DIAGNOSIS — C20 Malignant neoplasm of rectum: Secondary | ICD-10-CM | POA: Diagnosis not present

## 2017-11-24 DIAGNOSIS — Z433 Encounter for attention to colostomy: Secondary | ICD-10-CM | POA: Diagnosis not present

## 2017-11-25 DIAGNOSIS — Z433 Encounter for attention to colostomy: Secondary | ICD-10-CM | POA: Diagnosis not present

## 2017-11-25 DIAGNOSIS — C20 Malignant neoplasm of rectum: Secondary | ICD-10-CM | POA: Diagnosis not present

## 2017-11-26 DIAGNOSIS — C20 Malignant neoplasm of rectum: Secondary | ICD-10-CM | POA: Diagnosis not present

## 2017-11-26 DIAGNOSIS — Z433 Encounter for attention to colostomy: Secondary | ICD-10-CM | POA: Diagnosis not present

## 2017-11-27 DIAGNOSIS — C20 Malignant neoplasm of rectum: Secondary | ICD-10-CM | POA: Diagnosis not present

## 2017-11-27 DIAGNOSIS — Z433 Encounter for attention to colostomy: Secondary | ICD-10-CM | POA: Diagnosis not present

## 2017-12-27 DIAGNOSIS — C20 Malignant neoplasm of rectum: Secondary | ICD-10-CM | POA: Diagnosis not present

## 2017-12-27 DIAGNOSIS — Z433 Encounter for attention to colostomy: Secondary | ICD-10-CM | POA: Diagnosis not present

## 2017-12-28 DIAGNOSIS — Z433 Encounter for attention to colostomy: Secondary | ICD-10-CM | POA: Diagnosis not present

## 2017-12-28 DIAGNOSIS — C20 Malignant neoplasm of rectum: Secondary | ICD-10-CM | POA: Diagnosis not present

## 2017-12-29 DIAGNOSIS — C20 Malignant neoplasm of rectum: Secondary | ICD-10-CM | POA: Diagnosis not present

## 2017-12-29 DIAGNOSIS — Z433 Encounter for attention to colostomy: Secondary | ICD-10-CM | POA: Diagnosis not present

## 2017-12-30 DIAGNOSIS — C20 Malignant neoplasm of rectum: Secondary | ICD-10-CM | POA: Diagnosis not present

## 2017-12-30 DIAGNOSIS — Z433 Encounter for attention to colostomy: Secondary | ICD-10-CM | POA: Diagnosis not present

## 2017-12-31 DIAGNOSIS — Z433 Encounter for attention to colostomy: Secondary | ICD-10-CM | POA: Diagnosis not present

## 2017-12-31 DIAGNOSIS — C20 Malignant neoplasm of rectum: Secondary | ICD-10-CM | POA: Diagnosis not present

## 2018-01-01 DIAGNOSIS — Z433 Encounter for attention to colostomy: Secondary | ICD-10-CM | POA: Diagnosis not present

## 2018-01-01 DIAGNOSIS — C20 Malignant neoplasm of rectum: Secondary | ICD-10-CM | POA: Diagnosis not present

## 2018-01-02 DIAGNOSIS — C20 Malignant neoplasm of rectum: Secondary | ICD-10-CM | POA: Diagnosis not present

## 2018-01-02 DIAGNOSIS — Z433 Encounter for attention to colostomy: Secondary | ICD-10-CM | POA: Diagnosis not present

## 2018-01-03 DIAGNOSIS — C20 Malignant neoplasm of rectum: Secondary | ICD-10-CM | POA: Diagnosis not present

## 2018-01-03 DIAGNOSIS — Z433 Encounter for attention to colostomy: Secondary | ICD-10-CM | POA: Diagnosis not present

## 2018-01-04 DIAGNOSIS — Z433 Encounter for attention to colostomy: Secondary | ICD-10-CM | POA: Diagnosis not present

## 2018-01-04 DIAGNOSIS — C20 Malignant neoplasm of rectum: Secondary | ICD-10-CM | POA: Diagnosis not present

## 2018-01-05 DIAGNOSIS — C20 Malignant neoplasm of rectum: Secondary | ICD-10-CM | POA: Diagnosis not present

## 2018-01-05 DIAGNOSIS — Z433 Encounter for attention to colostomy: Secondary | ICD-10-CM | POA: Diagnosis not present

## 2018-01-31 DIAGNOSIS — C20 Malignant neoplasm of rectum: Secondary | ICD-10-CM | POA: Diagnosis not present

## 2018-01-31 DIAGNOSIS — Z433 Encounter for attention to colostomy: Secondary | ICD-10-CM | POA: Diagnosis not present

## 2018-03-02 ENCOUNTER — Telehealth: Payer: Self-pay | Admitting: Oncology

## 2018-03-02 ENCOUNTER — Inpatient Hospital Stay: Payer: Medicare Other | Attending: Oncology | Admitting: Oncology

## 2018-03-02 VITALS — BP 178/76 | HR 78 | Temp 98.7°F | Resp 19 | Ht 63.5 in | Wt 152.5 lb

## 2018-03-02 DIAGNOSIS — C20 Malignant neoplasm of rectum: Secondary | ICD-10-CM | POA: Diagnosis not present

## 2018-03-02 DIAGNOSIS — Z8542 Personal history of malignant neoplasm of other parts of uterus: Secondary | ICD-10-CM

## 2018-03-02 DIAGNOSIS — R591 Generalized enlarged lymph nodes: Secondary | ICD-10-CM | POA: Insufficient documentation

## 2018-03-02 DIAGNOSIS — Z923 Personal history of irradiation: Secondary | ICD-10-CM | POA: Diagnosis not present

## 2018-03-02 NOTE — Telephone Encounter (Signed)
Scheduled appt per 01/30 los.  Printed calendar and after summary visit per patient request.

## 2018-03-02 NOTE — Progress Notes (Signed)
  [facility] OFFICE PROGRESS NOTE   Diagnosis: Rectal cancer  INTERVAL HISTORY:   [name] returns for a scheduled visit.  She feels well.  Good appetite.  No difficulty with the colostomy.  She has intermittent discharge from the rectum.  No pain or bleeding.  Objective:  Vital signs in last 24 hours:  Blood pressure (!) 178/76, pulse 78, temperature 98.7 F (37.1 C), temperature source Oral, resp. rate 19, height 5' 3.5" (1.613 m), weight 152 lb 8 oz (69.2 kg), SpO2 100 %.    HEENT: Neck without mass Lymphatics: No cervical or supraclavicular nodes.  3 cm firm medial left inguinal node Resp: Lungs clear bilaterally Cardio: Regular rhythm, 2/6 systolic murmur GI: No hepatosplenomegaly, nontender, no mass, left lower quadrant colostomy Vascular: Trace pitting edema at the left greater than right lower leg   Medications: I have reviewed the patient's current medications.   Assessment/Plan: 1. Rectal cancer, clinical stage II (uT3, uN0).  Microsatellite stable, no loss of mismatch repair protein expression.   Trial of Xeloda 1 week on/1 week off beginning around 01/17/2013.   CEA improved 03/06/2013.  Xeloda continued until approximately May of 2015 when she developed a bowel obstruction requiring a diverting colostomy  Initiation of palliative radiation 11/04/2015; completion of palliative radiation 11/17/2015 2. Rectal pressure/urgency secondary to #1. 3. Remote history of uterine cancer treated with surgery and adjuvant radiation. 4. History of Early hand-foot syndrome manifesting with erythema over the palms and soles. 5. Bleeding secondary to progression of rectal/vaginal tumor-improved following palliative radiation     Disposition: [name] appears well.  She appears asymptomatic from the rectal cancer.  There is a firm left inguinal node.  This likely represents metastatic disease.  We decided to continue observation.  She will return  for an office visit in May prior to relocating to [place] for the summer.  [name], MD  03/02/2018  1:02 PM

## 2018-03-02 NOTE — Patient Instructions (Signed)
Please bring copy of your Advanced Directive/Living Will at next visit to be scanned into your chart

## 2018-05-16 ENCOUNTER — Telehealth: Payer: Self-pay | Admitting: *Deleted

## 2018-05-16 NOTE — Telephone Encounter (Signed)
Called to request cancellation of her OV on 06/05/18. Going to [place] now [place] the [place]. Does not want to come in until September. Rescheduled per her request. Encouraged her to continue to monitor the left inguinal node Dr. Benay Spice has been following and be sure to be seen if it gets larger. She understands and agrees.

## 2018-05-17 ENCOUNTER — Telehealth: Payer: Self-pay | Admitting: *Deleted

## 2018-05-17 NOTE — Telephone Encounter (Signed)
Called to report she had small amount of blood on her pad last night that she says was from her rectum. Has not had anymore bleeding. She denies that her stools are hard. Most likely a hemorrhoid or ruptured capillary.  Informed her to continue to monitor and if bleeding continues or increases be sure to call or see MD wherever she is living. Encouraged her to be sure that her stools are soft.

## 2018-06-05 ENCOUNTER — Ambulatory Visit: Payer: Medicare Other | Admitting: Oncology

## 2018-10-23 ENCOUNTER — Other Ambulatory Visit: Payer: Self-pay

## 2018-10-23 ENCOUNTER — Inpatient Hospital Stay: Payer: Medicare Other | Attending: Oncology | Admitting: Oncology

## 2018-10-23 VITALS — BP 186/85 | HR 80 | Temp 98.7°F | Resp 17 | Ht 63.5 in | Wt 153.6 lb

## 2018-10-23 DIAGNOSIS — Z923 Personal history of irradiation: Secondary | ICD-10-CM | POA: Diagnosis not present

## 2018-10-23 DIAGNOSIS — Z8542 Personal history of malignant neoplasm of other parts of uterus: Secondary | ICD-10-CM | POA: Diagnosis not present

## 2018-10-23 DIAGNOSIS — I10 Essential (primary) hypertension: Secondary | ICD-10-CM | POA: Diagnosis not present

## 2018-10-23 DIAGNOSIS — C20 Malignant neoplasm of rectum: Secondary | ICD-10-CM | POA: Diagnosis not present

## 2018-10-23 NOTE — Progress Notes (Signed)
  [facility] OFFICE PROGRESS NOTE   Diagnosis: Rectal cancer  INTERVAL HISTORY:   [name] returns as scheduled.  She feels well.  She returned from [place] on 10/20/2018.  No pain.  She reports 2 episodes of rectal bleeding since she was last seen here.  She was evaluated in the emergency room and by an oncologist after an episode of bleeding in [place].  Objective:  Vital signs in last 24 hours:  Blood pressure (!) 186/85, pulse 80, temperature 98.7 F (37.1 C), temperature source Temporal, resp. rate 17, height 5' 3.5" (1.613 m), weight 153 lb 9.6 oz (69.7 kg), SpO2 99 %.    Lymphatics: 3-4 cm firm mass in the medial left inguinal area, no cervical, supraclavicular, or right inguinal nodes GI: Soft, nontender, left lower quadrant colostomy Vascular: Chronic stasis change of the lower leg bilaterally   Lab Results:  Lab Results  Component Value Date   WBC 6.3 12/30/2015   HGB 12.5 12/30/2015   HCT 38.3 12/30/2015   MCV 93.6 12/30/2015   PLT 210 12/30/2015   NEUTROABS 4.1 12/30/2015    CMP  Lab Results  Component Value Date   NA 141 05/28/2013   K 3.8 05/28/2013   CL 105 11/14/2012   CO2 25 05/28/2013   GLUCOSE 108 05/28/2013   BUN 12.2 05/28/2013   CREATININE 0.8 05/28/2013   CALCIUM 9.8 05/28/2013   PROT 6.3 (L) 05/28/2013   ALBUMIN 3.4 (L) 05/28/2013   AST 23 05/28/2013   ALT 11 05/28/2013   ALKPHOS 105 05/28/2013   BILITOT 0.93 05/28/2013    Medications: I have reviewed the patient's current medications.   Assessment/Plan:  1. Rectal cancer, clinical stage II (uT3, uN0).  Microsatellite stable, no loss of mismatch repair protein expression.   Trial of Xeloda 1 week on/1 week off beginning around 01/17/2013.   CEA improved 03/06/2013.  Xeloda continued until approximately May of 2015 when she developed a bowel obstruction requiring a diverting colostomy  Initiation of palliative radiation 11/04/2015; completion of  palliative radiation 11/17/2015 2. Rectal pressure/urgency secondary to #1. 3. Remote history of uterine cancer treated with surgery and adjuvant radiation. 4. History of Early hand-foot syndrome manifesting with erythema over the palms and soles. 5. Bleeding secondary to progression of rectal/vaginal tumor-improved following palliative radiation    Disposition: Ms. Guthridge appears unchanged.  The nodal mass in the left groin appears to be slowly enlarging.  The plan is to continue observation as long as she has no pain or consistent bleeding.  She will contact us for new symptoms.  Ms. Hansley will return for an office visit in 5 months.  Her daughter was present by telephone for today's visit.  I recommended she follow-up with a primary provider for management of hypertension.  She reports receiving an influenza vaccine.  [name], MD  10/23/2018  10:21 AM

## 2018-10-24 ENCOUNTER — Telehealth: Payer: Self-pay | Admitting: Oncology

## 2018-10-24 NOTE — Telephone Encounter (Signed)
Called and spoke with patients daughter. Confirmed appt  °

## 2019-02-16 ENCOUNTER — Ambulatory Visit: Payer: Medicare Other | Attending: Internal Medicine

## 2019-02-16 DIAGNOSIS — Z23 Encounter for immunization: Secondary | ICD-10-CM

## 2019-02-16 NOTE — Progress Notes (Signed)
   Covid-19 Vaccination Clinic  Name:  Stephanie Burns    MRN: [id number] DOB: October 11, 1918  02/16/2019  [name] was observed post Covid-19 immunization for 15 minutes without incidence. She was provided with Vaccine Information Sheet and instruction to access the V-Safe system.   [name] was instructed to call 911 with any severe reactions post vaccine: Marland Kitchen Difficulty breathing  . Swelling of your face and throat  . A fast heartbeat  . A bad rash all over your body  . Dizziness and weakness    Immunizations Administered    Name Date Dose VIS Date Route   Pfizer COVID-19 Vaccine 02/16/2019 11:54 AM 0.3 mL 01/12/2019 Intramuscular   Manufacturer: [place]   Lot: [id number]   [place]: [id number]

## 2019-03-09 ENCOUNTER — Ambulatory Visit: Payer: Medicare Other | Attending: Internal Medicine

## 2019-03-09 DIAGNOSIS — Z23 Encounter for immunization: Secondary | ICD-10-CM

## 2019-03-09 NOTE — Progress Notes (Signed)
   Covid-19 Vaccination Clinic  Name:  Stephanie Burns    MRN: [id number] DOB: [birth year]  03/09/2019  [name] was observed post Covid-19 immunization for 15 minutes without incidence. She was provided with Vaccine Information Sheet and instruction to access the V-Safe system.   [name] was instructed to call 911 with any severe reactions post vaccine: Marland Kitchen Difficulty breathing  . Swelling of your face and throat  . A fast heartbeat  . A bad rash all over your body  . Dizziness and weakness    Immunizations Administered    Name Date Dose VIS Date Route   Pfizer COVID-19 Vaccine 03/09/2019 11:46 AM 0.3 mL 01/12/2019 Intramuscular   Manufacturer: [employer]   Lot: [id number]   [place]: [id number]

## 2019-03-26 ENCOUNTER — Inpatient Hospital Stay: Payer: Medicare Other | Attending: Oncology | Admitting: Oncology

## 2019-03-26 ENCOUNTER — Inpatient Hospital Stay: Payer: Medicare Other

## 2019-03-26 ENCOUNTER — Other Ambulatory Visit: Payer: Self-pay

## 2019-03-26 ENCOUNTER — Ambulatory Visit (HOSPITAL_COMMUNITY)
Admission: RE | Admit: 2019-03-26 | Discharge: 2019-03-26 | Disposition: A | Discharge status: Home / Self Care | Payer: Medicare Other | Source: Ambulatory Visit | Attending: Oncology | Admitting: Oncology

## 2019-03-26 VITALS — BP 152/98 | HR 118 | Temp 98.3°F | Resp 18 | Ht 63.5 in | Wt 153.7 lb

## 2019-03-26 DIAGNOSIS — Z9221 Personal history of antineoplastic chemotherapy: Secondary | ICD-10-CM | POA: Insufficient documentation

## 2019-03-26 DIAGNOSIS — Z933 Colostomy status: Secondary | ICD-10-CM | POA: Diagnosis not present

## 2019-03-26 DIAGNOSIS — C20 Malignant neoplasm of rectum: Secondary | ICD-10-CM | POA: Insufficient documentation

## 2019-03-26 DIAGNOSIS — R000 Tachycardia, unspecified: Secondary | ICD-10-CM | POA: Diagnosis not present

## 2019-03-26 DIAGNOSIS — Z8542 Personal history of malignant neoplasm of other parts of uterus: Secondary | ICD-10-CM | POA: Diagnosis not present

## 2019-03-26 DIAGNOSIS — E8770 Fluid overload, unspecified: Secondary | ICD-10-CM | POA: Insufficient documentation

## 2019-03-26 DIAGNOSIS — Z923 Personal history of irradiation: Secondary | ICD-10-CM | POA: Diagnosis not present

## 2019-03-26 LAB — BASIC METABOLIC PANEL - CANCER CENTER ONLY
Anion gap: 11 (ref 5–15)
BUN: 18 mg/dL (ref 8–23)
CO2: 21 mmol/L — ABNORMAL LOW (ref 22–32)
Calcium: 9.5 mg/dL (ref 8.9–10.3)
Chloride: 107 mmol/L (ref 98–111)
Creatinine: 0.99 mg/dL (ref 0.44–1.00)
GFR, Est AFR Am: 55 mL/min — ABNORMAL LOW
GFR, Estimated: 47 mL/min — ABNORMAL LOW
Glucose, Bld: 126 mg/dL — ABNORMAL HIGH (ref 70–99)
Potassium: 3.8 mmol/L (ref 3.5–5.1)
Sodium: 139 mmol/L (ref 135–145)

## 2019-03-26 LAB — CBC WITH DIFFERENTIAL (CANCER CENTER ONLY)
Abs Immature Granulocytes: 0.02 10*3/uL (ref 0.00–0.07)
Basophils Absolute: 0.1 10*3/uL (ref 0.0–0.1)
Basophils Relative: 1 %
Eosinophils Absolute: 0 10*3/uL (ref 0.0–0.5)
Eosinophils Relative: 0 %
HCT: 39.4 % (ref 36.0–46.0)
Hemoglobin: 12.6 g/dL (ref 12.0–15.0)
Immature Granulocytes: 0 %
Lymphocytes Relative: 11 %
Lymphs Abs: 0.9 10*3/uL (ref 0.7–4.0)
MCH: 30.4 pg (ref 26.0–34.0)
MCHC: 32 g/dL (ref 30.0–36.0)
MCV: 94.9 fL (ref 80.0–100.0)
Monocytes Absolute: 0.8 10*3/uL (ref 0.1–1.0)
Monocytes Relative: 10 %
Neutro Abs: 6.3 10*3/uL (ref 1.7–7.7)
Neutrophils Relative %: 78 %
Platelet Count: 218 10*3/uL (ref 150–400)
RBC: 4.15 MIL/uL (ref 3.87–5.11)
RDW: 13.8 % (ref 11.5–15.5)
WBC Count: 8.2 10*3/uL (ref 4.0–10.5)
nRBC: 0 % (ref 0.0–0.2)

## 2019-03-26 NOTE — Progress Notes (Signed)
  [facility] OFFICE PROGRESS NOTE   Diagnosis: Rectal cancer  INTERVAL HISTORY:   [name] returns for a scheduled visit.  She feels well.  Good appetite.  No rectal bleeding.  She has a mucoid discharge from the rectum.  The colostomy is functioning well.  She has noted increased left greater than right leg edema.  Mild dyspnea.  She has received the COVID-19 vaccine.  Objective:  Vital signs in last 24 hours:  Blood pressure (!) 152/98, pulse (!) 118, temperature 98.3 F (36.8 C), temperature source Temporal, resp. rate 18, height 5' 3.5" (1.613 m), weight 153 lb 11.2 oz (69.7 kg), SpO2 95 %.   HEENT: The conjunctivae are pink Lymphatics: 4-5 cm firm left inguinal node, no cervical, supraclavicular, right inguinal, or axillary nodes. Resp: End inspiratory rales at the right greater than left posterior base Cardio: Tachycardia, regular rhythm, mild JVD? GI: No hepatosplenomegaly, left lower quadrant colostomy Vascular: 1-2+ pitting edema at the left greater than right lower leg and foot   Lab Results:  Lab Results  Component Value Date   WBC 6.3 12/30/2015   HGB 12.5 12/30/2015   HCT 38.3 12/30/2015   MCV 93.6 12/30/2015   PLT 210 12/30/2015   NEUTROABS 4.1 12/30/2015    CMP  Lab Results  Component Value Date   NA 141 05/28/2013   K 3.8 05/28/2013   CL 105 11/14/2012   CO2 25 05/28/2013   GLUCOSE 108 05/28/2013   BUN 12.2 05/28/2013   CREATININE 0.8 05/28/2013   CALCIUM 9.8 05/28/2013   PROT 6.3 (L) 05/28/2013   ALBUMIN 3.4 (L) 05/28/2013   AST 23 05/28/2013   ALT 11 05/28/2013   ALKPHOS 105 05/28/2013   BILITOT 0.93 05/28/2013    Medications: I have reviewed the patient's current medications.   Assessment/Plan: 1. Rectal cancer, clinical stage II (uT3, uN0).  Microsatellite stable, no loss of mismatch repair protein expression.   Trial of Xeloda 1 week on/1 week off beginning around 01/17/2013.   CEA improved 03/06/2013.   Xeloda continued until approximately May of 2015 when she developed a bowel obstruction requiring a diverting colostomy  Initiation of palliative radiation 11/04/2015; completion of palliative radiation 11/17/2015 2. Rectal pressure/urgency secondary to #1. 3. Remote history of uterine cancer treated with surgery and adjuvant radiation. 4. History of Early hand-foot syndrome manifesting with erythema over the palms and soles. 5. Bleeding secondary to progression of rectal/vaginal tumor-improved following palliative radiation   Disposition: [name] appears stable from an oncology standpoint.  There is no clinical evidence for progression of the rectal cancer aside from slight enlargement of the left inguinal mass.  She has tachycardia and evidence of volume overload on exam today.  She does not have a primary physician in [place].  We will check a BNP, EKG, chemistry panel, and chest x-ray today.  We will arrange for follow-up here or a referral based on today's evaluation.  She plans to leave for [place] in April and will return in September.  She will be scheduled for a routine visit in September. [name], MD  03/26/2019  1:28 PM

## 2019-03-27 ENCOUNTER — Telehealth: Payer: Self-pay | Admitting: *Deleted

## 2019-03-27 NOTE — Telephone Encounter (Signed)
Notified daughter of lab results from 2/21 and that per Dr. Benay Spice, CXR shows CHF. He has spoken with cardiology and they will be calling today to set up an appointment.

## 2019-03-28 ENCOUNTER — Telehealth (HOSPITAL_COMMUNITY): Payer: Self-pay

## 2019-03-28 NOTE — Telephone Encounter (Signed)
COVID-19 pre-appointment screening questions: DAUGHTER JERRI ANSWERED QUESTIONS   Do you have a history of COVID-19 or a positive test result in the past 7-10 days? NO  To the best of your knowledge, have you been in close contact with anyone with a confirmed diagnosis of COVID 19?NO  Have you had any one or more of the following: Fever, chills, cough, shortness of breath (out of the normal for you) or any flu-like symptoms?NO  Are you experiencing any of the following symptoms that is new or out of usual for you:NO   Ear, nose or throat discomfort  Sore throat  Headache  Muscle Pain  Diarrhea  Loss of taste or smell   Reviewed all the following with patient: REVIEWED  Use of hand sanitizer when entering the building  Everyone is required to wear a mask in the building, if you do not have a mask we are happy to provide you with one when you arrive  NO Visitor guidelines   If patient answers YES to any of questions they must change to a virtual visit and place note in comments about symptoms

## 2019-03-29 ENCOUNTER — Other Ambulatory Visit: Payer: Self-pay

## 2019-03-29 ENCOUNTER — Encounter (HOSPITAL_COMMUNITY): Payer: Self-pay | Admitting: Internal Medicine

## 2019-03-29 ENCOUNTER — Ambulatory Visit (HOSPITAL_COMMUNITY)
Admission: RE | Admit: 2019-03-29 | Discharge: 2019-03-29 | Disposition: A | Discharge status: Home / Self Care | Payer: Medicare Other | Source: Ambulatory Visit | Attending: Internal Medicine | Admitting: Internal Medicine

## 2019-03-29 VITALS — BP 156/96 | HR 96 | Wt 153.0 lb

## 2019-03-29 DIAGNOSIS — Z933 Colostomy status: Secondary | ICD-10-CM | POA: Diagnosis not present

## 2019-03-29 DIAGNOSIS — I110 Hypertensive heart disease with heart failure: Secondary | ICD-10-CM | POA: Diagnosis not present

## 2019-03-29 DIAGNOSIS — I5022 Chronic systolic (congestive) heart failure: Secondary | ICD-10-CM

## 2019-03-29 DIAGNOSIS — Z79899 Other long term (current) drug therapy: Secondary | ICD-10-CM | POA: Diagnosis not present

## 2019-03-29 DIAGNOSIS — Z923 Personal history of irradiation: Secondary | ICD-10-CM | POA: Diagnosis not present

## 2019-03-29 DIAGNOSIS — Z8542 Personal history of malignant neoplasm of other parts of uterus: Secondary | ICD-10-CM | POA: Diagnosis not present

## 2019-03-29 DIAGNOSIS — I447 Left bundle-branch block, unspecified: Secondary | ICD-10-CM

## 2019-03-29 DIAGNOSIS — I4891 Unspecified atrial fibrillation: Secondary | ICD-10-CM | POA: Insufficient documentation

## 2019-03-29 DIAGNOSIS — Z8049 Family history of malignant neoplasm of other genital organs: Secondary | ICD-10-CM | POA: Insufficient documentation

## 2019-03-29 DIAGNOSIS — M1990 Unspecified osteoarthritis, unspecified site: Secondary | ICD-10-CM | POA: Diagnosis not present

## 2019-03-29 DIAGNOSIS — I10 Essential (primary) hypertension: Secondary | ICD-10-CM

## 2019-03-29 DIAGNOSIS — I5021 Acute systolic (congestive) heart failure: Secondary | ICD-10-CM | POA: Diagnosis not present

## 2019-03-29 DIAGNOSIS — Z85048 Personal history of other malignant neoplasm of rectum, rectosigmoid junction, and anus: Secondary | ICD-10-CM | POA: Diagnosis not present

## 2019-03-29 DIAGNOSIS — I480 Paroxysmal atrial fibrillation: Secondary | ICD-10-CM

## 2019-03-29 MED ORDER — APIXABAN 5 MG PO TABS: 5.0000 mg | ORAL_TABLET | Freq: Two times a day (BID) | ORAL | 4 refills | Status: DC

## 2019-03-29 MED ORDER — POTASSIUM CHLORIDE 20 MEQ/15ML (10%) PO SOLN: 20.0000 meq | Freq: Every day | ORAL | 2 refills | Status: DC

## 2019-03-29 MED ORDER — FUROSEMIDE 40 MG PO TABS: 40.0000 mg | ORAL_TABLET | Freq: Every day | ORAL | 6 refills | Status: DC

## 2019-03-29 MED ORDER — CARVEDILOL 3.125 MG PO TABS: 3.1250 mg | ORAL_TABLET | Freq: Two times a day (BID) | ORAL | 3 refills | Status: DC

## 2019-03-29 NOTE — Patient Instructions (Signed)
START Lasix (furosemide) 40mg  1 tab daily  START Potassium 20 meq 50ml daily  START Carvedilol (Coreg) 3.125mg  1 tab twice a day   Your physician has requested that you have an echocardiogram. Echocardiography is a painless test that uses sound waves to create images of your heart. It provides your doctor with information about the size and shape of your heart and how well your heart's chambers and valves are working. This procedure takes approximately one hour. There are no restrictions for this procedure.   Your physician recommends that you schedule a follow-up appointment in: 1 week with Dr Haroldine Laws  Please call office at [phone] option 2 if you have any questions or concerns.   At the [facility], you and your health needs are our priority. As part of our continuing mission to provide you with exceptional heart care, we have created designated Provider Care Teams. These Care Teams include your primary Cardiologist (physician) and Advanced Practice Providers (APPs- Physician Assistants and Nurse Practitioners) who all work together to provide you with the care you need, when you need it.   You may see any of the following providers on your designated Care Team at your next follow up: Marland Kitchen Dr Glori Bickers . Dr Loralie Champagne . Darrick Grinder, NP . Lyda Jester, PA . Audry Riles, PharmD   Please be sure to bring in all your medications bottles to every appointment.

## 2019-03-29 NOTE — Progress Notes (Addendum)
ADVANCED HF CLINIC CONSULT NOTE  Referring Physician: Dr. Benay Spice   HPI:  Ms. Escobar is a 86 y/o woman with h/o HTN, rectal CA (1995) s/p palliative XRT and diverting colostomy referred by Dr. Benay Spice for further evaluation of LE edema.   Denies any h/o known heart disease. Started having LE swelling in past few weeks and also new onset DOE. Saw Dr. Benay Spice earlier this week and had CXR with pulmonary edema and cardiomegaly. ECG with LBBB and frequent PACs. Denies CP, orthopnea or PND. No palpitations, syncope or presyncope  Review of Systems: [y] = yes, [ ]  = no   General: Weight gain [ ] ; Weight loss [ ] ; Anorexia [ ] ; Fatigue Blue.Reese ]; Fever [ ] ; Chills [ ] ; Weakness [ y]  Cardiac: Chest pain/pressure [ ] ; Resting SOB [ ] ; Exertional SOB [ y]; Orthopnea [ ] ; Pedal Edema [ y]; Palpitations [ ] ; Syncope [ ] ; Presyncope [ ] ; Paroxysmal nocturnal dyspnea[ ]   Pulmonary: Cough [ ] ; Wheezing[ ] ; Hemoptysis[ ] ; Sputum [ ] ; Snoring [ ]   GI: Vomiting[ ] ; Dysphagia[ ] ; Melena[ ] ; Hematochezia [ ] ; Heartburn[ ] ; Abdominal pain [ ] ; Constipation [ ] ; Diarrhea [ ] ; BRBPR [ ]   GU: Hematuria[ ] ; Dysuria [ ] ; Nocturia[ ]   Vascular: Pain in legs with walking [ ] ; Pain in feet with lying flat [ ] ; Non-healing sores [ ] ; Stroke [ ] ; TIA [ ] ; Slurred speech [ ] ;  Neuro: Headaches[ ] ; Vertigo[ ] ; Seizures[ ] ; Paresthesias[ ] ;Blurred vision [ ] ; Diplopia [ ] ; Vision changes [ ]   Ortho/Skin: Arthritis Blue.Reese ]; Joint pain Blue.Reese ]; Muscle pain [ ] ; Joint swelling [ ] ; Back Pain [ ] ; Rash [ ]   Psych: Depression[ ] ; Anxiety[ ]   Heme: Bleeding problems [ ] ; Clotting disorders [ ] ; Anemia [ ]   Endocrine: Diabetes [ ] ; Thyroid dysfunction[ ]    Past Medical History:  Diagnosis Date  . Arthritis    fingers  . Cataract 11-15-12   one eye  . Passage of bloody stools 11-15-12   occ.  Marland Kitchen Uterine cancer ([place])    12-15 years ago.  Tx'd with XRT    Current Outpatient Medications  Medication Sig Dispense Refill  .  Cholecalciferol (VITAMIN D-3) 1000 UNITS CAPS Take 1,000 Units by mouth daily.    . vitamin B-12 (CYANOCOBALAMIN) 1000 MCG tablet Take 1,000 mcg by mouth daily.     No current facility-administered medications for this encounter.    No Known Allergies    Social History   Socioeconomic History  . Marital status: Widowed    Spouse name: Not on file  . Number of children: 1  . Years of education: Not on file  . Highest education level: Not on file  Occupational History  . Not on file  Tobacco Use  . Smoking status: Never Smoker  . Smokeless tobacco: Never Used  Substance and Sexual Activity  . Alcohol use: No    Comment: very rare wine  . Drug use: No  . Sexual activity: Not Currently  Other Topics Concern  . Not on file  Social History Narrative   Widowed   Hard of hearing   Lives alone in [place] but comes to [facility] for the winter and stays with daughter   Has only one daughter whom lives in [place]   Social Determinants of Health   Financial Resource Strain:   . Difficulty of Paying Living Expenses: Not on file  Food Insecurity:   . Worried About Running  Out of Food in the Last Year: Not on file  . Ran Out of Food in the Last Year: Not on file  Transportation Needs:   . Lack of Transportation (Medical): Not on file  . Lack of Transportation (Non-Medical): Not on file  Physical Activity:   . Days of Exercise per Week: Not on file  . Minutes of Exercise per Session: Not on file  Stress:   . Feeling of Stress : Not on file  Social Connections:   . Frequency of Communication with Friends and Family: Not on file  . Frequency of Social Gatherings with Friends and Family: Not on file  . Attends Religious Services: Not on file  . Active Member of Clubs or Organizations: Not on file  . Attends Archivist Meetings: Not on file  . Marital Status: Not on file  Intimate Partner Violence:   . Fear of Current or Ex-Partner: Not on file  . Emotionally Abused: Not on file    . Physically Abused: Not on file  . Sexually Abused: Not on file      Family History  Problem Relation Age of Onset  . Uterine cancer Mother   . Cancer Mother        cant remember    Vitals:   03/29/19 1438  BP: (!) 156/96  Pulse: 96  SpO2: 95%  Weight: 69.4 kg (153 lb)    PHYSICAL EXAM: General: Elderly Frail sitting in WC No respiratory difficulty HEENT: normal Neck: supple. JVP 9 + prominent CV waves. Carotids 2+ bilat; no bruits. No lymphadenopathy or thryomegaly appreciated. Cor: PMI nondisplaced. Regular rate & rhythm. No rubs, gallops or murmurs. Lungs: + crackles  Abdomen: soft, nontender, nondistended. No hepatosplenomegaly. No bruits or masses. Good bowel sounds. Colostomy pouch Extremities: no cyanosis, clubbing, rash, 2+ edema Neuro: alert & oriented x 3, cranial nerves grossly intact. moves all 4 extremities w/o difficulty. Affect pleasant.  ECG: AF 105 LBBB Personally reviewed .Personally reviewed    ASSESSMENT & PLAN:  1. Acute systolic HF - I did bedside echo in clinic. Although limiteed images EF appears to be in the 30% range. No effusion. She is also found to have new onset AF  - Unclear if AF (or LBBB) has caused her cardiomyopathy or vice versa - Will attempt to manage as outpatient but I made it clear to her and her daughter that if she has worsening SOB and/or is not diuresing she should come to ER  - Start lasix 40 daily + kdur 20 daily (liquid) - Start carvedilol  - Place compression hose - See abck next week with echo   2. New onset AF - mild RVR - will start Eliquis 5 bid (does not meet criteria for dose reduction) - start carvedilol 3.125 - consider DC-CV in 1 month. However given previous ECGs with frequent PACs doubt she will hold NSR for long. Best option may be to start amio at that point - Ideally could consider AVN ablation and CRT but age likely prohibitive  3. HTN - longstanding.  - adding carvedilol.  Glori Bickers, MD   5:24 PM

## 2019-04-04 ENCOUNTER — Encounter (HOSPITAL_COMMUNITY): Payer: Self-pay | Admitting: Internal Medicine

## 2019-04-04 ENCOUNTER — Other Ambulatory Visit: Payer: Self-pay

## 2019-04-04 ENCOUNTER — Ambulatory Visit (HOSPITAL_BASED_OUTPATIENT_CLINIC_OR_DEPARTMENT_OTHER)
Admission: RE | Admit: 2019-04-04 | Discharge: 2019-04-04 | Disposition: A | Discharge status: Home / Self Care | Payer: Medicare Other | Source: Ambulatory Visit | Attending: Internal Medicine | Admitting: Internal Medicine

## 2019-04-04 ENCOUNTER — Telehealth: Payer: Self-pay | Admitting: Oncology

## 2019-04-04 ENCOUNTER — Encounter: Payer: Self-pay | Admitting: *Deleted

## 2019-04-04 ENCOUNTER — Ambulatory Visit (HOSPITAL_COMMUNITY)
Admission: RE | Admit: 2019-04-04 | Discharge: 2019-04-04 | Disposition: A | Discharge status: Home / Self Care | Payer: Medicare Other | Source: Ambulatory Visit | Attending: Internal Medicine | Admitting: Internal Medicine

## 2019-04-04 VITALS — BP 166/98 | HR 141 | Wt 150.2 lb

## 2019-04-04 DIAGNOSIS — I447 Left bundle-branch block, unspecified: Secondary | ICD-10-CM | POA: Diagnosis not present

## 2019-04-04 DIAGNOSIS — H9190 Unspecified hearing loss, unspecified ear: Secondary | ICD-10-CM | POA: Insufficient documentation

## 2019-04-04 DIAGNOSIS — I5021 Acute systolic (congestive) heart failure: Secondary | ICD-10-CM | POA: Insufficient documentation

## 2019-04-04 DIAGNOSIS — I480 Paroxysmal atrial fibrillation: Secondary | ICD-10-CM | POA: Diagnosis not present

## 2019-04-04 DIAGNOSIS — Z8542 Personal history of malignant neoplasm of other parts of uterus: Secondary | ICD-10-CM | POA: Diagnosis not present

## 2019-04-04 DIAGNOSIS — I4891 Unspecified atrial fibrillation: Secondary | ICD-10-CM | POA: Insufficient documentation

## 2019-04-04 DIAGNOSIS — I5022 Chronic systolic (congestive) heart failure: Secondary | ICD-10-CM

## 2019-04-04 DIAGNOSIS — Z7901 Long term (current) use of anticoagulants: Secondary | ICD-10-CM | POA: Insufficient documentation

## 2019-04-04 DIAGNOSIS — I493 Ventricular premature depolarization: Secondary | ICD-10-CM

## 2019-04-04 DIAGNOSIS — Z85048 Personal history of other malignant neoplasm of rectum, rectosigmoid junction, and anus: Secondary | ICD-10-CM | POA: Diagnosis not present

## 2019-04-04 DIAGNOSIS — Z8049 Family history of malignant neoplasm of other genital organs: Secondary | ICD-10-CM | POA: Diagnosis not present

## 2019-04-04 DIAGNOSIS — Z79899 Other long term (current) drug therapy: Secondary | ICD-10-CM | POA: Diagnosis not present

## 2019-04-04 DIAGNOSIS — I350 Nonrheumatic aortic (valve) stenosis: Secondary | ICD-10-CM | POA: Insufficient documentation

## 2019-04-04 DIAGNOSIS — I110 Hypertensive heart disease with heart failure: Secondary | ICD-10-CM | POA: Insufficient documentation

## 2019-04-04 DIAGNOSIS — M1990 Unspecified osteoarthritis, unspecified site: Secondary | ICD-10-CM | POA: Diagnosis not present

## 2019-04-04 DIAGNOSIS — Z923 Personal history of irradiation: Secondary | ICD-10-CM | POA: Insufficient documentation

## 2019-04-04 LAB — BASIC METABOLIC PANEL
Anion gap: 11 (ref 5–15)
BUN: 18 mg/dL (ref 8–23)
CO2: 23 mmol/L (ref 22–32)
Calcium: 9.6 mg/dL (ref 8.9–10.3)
Chloride: 102 mmol/L (ref 98–111)
Creatinine, Ser: 1.07 mg/dL — ABNORMAL HIGH (ref 0.44–1.00)
GFR calc Af Amer: 50 mL/min — ABNORMAL LOW (ref >60)
GFR calc non Af Amer: 43 mL/min — ABNORMAL LOW (ref >60)
Glucose, Bld: 114 mg/dL — ABNORMAL HIGH (ref 70–99)
Potassium: 4.3 mmol/L (ref 3.5–5.1)
Sodium: 136 mmol/L (ref 135–145)

## 2019-04-04 LAB — CBC
HCT: 41.3 % (ref 36.0–46.0)
Hemoglobin: 12.5 g/dL (ref 12.0–15.0)
MCH: 30.1 pg (ref 26.0–34.0)
MCHC: 30.3 g/dL (ref 30.0–36.0)
MCV: 99.5 fL (ref 80.0–100.0)
Platelets: 270 10*3/uL (ref 150–400)
RBC: 4.15 MIL/uL (ref 3.87–5.11)
RDW: 13.9 % (ref 11.5–15.5)
WBC: 6.8 10*3/uL (ref 4.0–10.5)
nRBC: 0 % (ref 0.0–0.2)

## 2019-04-04 LAB — BRAIN NATRIURETIC PEPTIDE: B Natriuretic Peptide: 1292.9 pg/mL — ABNORMAL HIGH (ref 0.0–100.0)

## 2019-04-04 MED ORDER — AMIODARONE HCL 200 MG PO TABS: 200.0000 mg | ORAL_TABLET | Freq: Every day | ORAL | 3 refills | Status: DC

## 2019-04-04 NOTE — Progress Notes (Signed)
Patient needs f/u in oncology in 4 weeks per Dr. Benay Spice. Scheduling message sent.

## 2019-04-04 NOTE — Progress Notes (Signed)
  Echocardiogram 2D Echocardiogram has been performed.  Stephanie Burns 04/04/2019, 12:19 PM

## 2019-04-04 NOTE — Telephone Encounter (Signed)
Scheduled appt per 3/3 sch msg. Pt is aware of appt date and time.

## 2019-04-04 NOTE — Patient Instructions (Addendum)
STOP Coreg  START Amiodarone 200mg  (1 tab) daily  Labs today We will only contact you if something comes back abnormal or we need to make some changes. Otherwise no news is good news!  Your physician recommends that you schedule a follow-up appointment in: 2 weeks with Dr Haroldine Laws   [place] Garage Code: [id number]   You were provided a prescription for compression hose.  You can bring this prescription to any medical supply store.  At the [facility], you and your health needs are our priority. As part of our continuing mission to provide you with exceptional heart care, we have created designated Provider Care Teams. These Care Teams include your primary Cardiologist (physician) and Advanced Practice Providers (APPs- Physician Assistants and Nurse Practitioners) who all work together to provide you with the care you need, when you need it.   You may see any of the following providers on your designated Care Team at your next follow up: Marland Kitchen Dr Glori Bickers . Dr Loralie Champagne . Darrick Grinder, NP . Lyda Jester, PA . Audry Riles, PharmD   Please be sure to bring in all your medications bottles to every appointment.

## 2019-04-04 NOTE — Progress Notes (Signed)
HF CLINIC CONSULT NOTE  Referring Physician: Dr. Benay Spice   HPI:  Stephanie Burns is a 86 y/o woman with h/o HTN, rectal CA (1995) s/p palliative XRT and diverting colostomy referred by Dr. Benay Spice for further evaluation of LE edema.   Denies any h/o known heart disease. Started having LE swelling in past few weeks and also new onset DOE. Saw Dr. Benay Spice in February and had CXR with pulmonary edema and cardiomegaly. ECG with LBBB and frequent PACs.  I saw her for the first time 03/29/19 and was in AF and volume overloaded. Bedside echo ~30%. Started on lasix, carvedilol and Eliquis.   Returns today for f/u. She is back in NSR. Weight down 3 pounds. Breathing better but has a lot of leg fatigue. + LE edema. No bleeding with Eliquis.   Echo today EF 15-20%. RV ok. Aortic valve heavily calcified. Mean gradient 22. AVA 0.45 cm2 VR 0.18   Past Medical History:  Diagnosis Date  . Arthritis    fingers  . Cataract 11-15-12   one eye  . Passage of bloody stools 11-15-12   occ.  Marland Kitchen Uterine cancer ([place])    12-15 years ago.  Tx'd with XRT    Current Outpatient Medications  Medication Sig Dispense Refill  . apixaban (ELIQUIS) 5 MG TABS tablet Take 1 tablet (5 mg total) by mouth 2 (two) times daily. 60 tablet 4  . carvedilol (COREG) 3.125 MG tablet Take 1 tablet (3.125 mg total) by mouth 2 (two) times daily. 60 tablet 3  . Cholecalciferol (VITAMIN D-3) 1000 UNITS CAPS Take 1,000 Units by mouth daily.    . furosemide (LASIX) 40 MG tablet Take 1 tablet (40 mg total) by mouth daily. 30 tablet 6  . potassium chloride 20 MEQ/15ML (10%) SOLN Take 15 mLs (20 mEq total) by mouth daily. 473 mL 2  . vitamin B-12 (CYANOCOBALAMIN) 1000 MCG tablet Take 1,000 mcg by mouth daily.     No current facility-administered medications for this encounter.    No Known Allergies    Social History   Socioeconomic History  . Marital status: Widowed    Spouse name: Not on file  . Number of children: 1  .  Years of education: Not on file  . Highest education level: Not on file  Occupational History  . Not on file  Tobacco Use  . Smoking status: Never Smoker  . Smokeless tobacco: Never Used  Substance and Sexual Activity  . Alcohol use: No    Comment: very rare wine  . Drug use: No  . Sexual activity: Not Currently  Other Topics Concern  . Not on file  Social History Narrative   Widowed   Hard of hearing   Lives alone in [place] but comes to [facility] for the winter and stays with daughter   Has only one daughter whom lives in [place]   Social Determinants of Health   Financial Resource Strain:   . Difficulty of Paying Living Expenses: Not on file  Food Insecurity:   . Worried About Charity fundraiser in the Last Year: Not on file  . Ran Out of Food in the Last Year: Not on file  Transportation Needs:   . Lack of Transportation (Medical): Not on file  . Lack of Transportation (Non-Medical): Not on file  Physical Activity:   . Days of Exercise per Week: Not on file  . Minutes of Exercise per Session: Not on file  Stress:   . Feeling  of Stress : Not on file  Social Connections:   . Frequency of Communication with Friends and Family: Not on file  . Frequency of Social Gatherings with Friends and Family: Not on file  . Attends Religious Services: Not on file  . Active Member of Clubs or Organizations: Not on file  . Attends Archivist Meetings: Not on file  . Marital Status: Not on file  Intimate Partner Violence:   . Fear of Current or Ex-Partner: Not on file  . Emotionally Abused: Not on file  . Physically Abused: Not on file  . Sexually Abused: Not on file      Family History  Problem Relation Age of Onset  . Uterine cancer Mother   . Cancer Mother        cant remember    Vitals:   04/04/19 1229  BP: (!) 166/98  Pulse: (!) 141  SpO2: 98%  Weight: 68.1 kg (150 lb 3.2 oz)    PHYSICAL EXAM: General: Elderly sitting in WC Neck: supple. JVP 6-7. Carotids slow  upstrokes + bruits. No lymphadenopathy or thryomegaly appreciated. Cor: PMI nondisplaced. Irregular rate & rhythm. Soft SEM RUSB Lungs: minimal crackles Abdomen: soft, nontender, nondistended. No hepatosplenomegaly. No bruits or masses. Good bowel sounds. Extremities: no cyanosis, clubbing, rash, 2-3+ ankle edema Neuro: alert & orientedx3, cranial nerves grossly intact. moves all 4 extremities w/o difficulty. Affect pleasant   ECG: NSR 72. Frequent PVCsLBBB Personally reviewed   ASSESSMENT & PLAN:  1. Acute systolic HF - onset likely 03/2019 - Echo today EF 15-20% with critical AS. RV ok - Suspect LV dysfunction multifactorial (end-stage AS, PAF, PVCs, LBBB) - has responded somewhat to medical therapy but still NYHA III-IIIb - On echo AS appears critical but exam less so. I reviewed with TAVR team and not felt to be candidate. Will focus on medical therapy - Continue lasix 40 daily + kdur 20 daily (liquid). Check labs today  - Stop carvedilol for now. Start amio 200 daily in an attempt to reduce AF and PVC burden - Place compression hose - See back in 2-3 weeks  2. New onset AF - back in NSR today with frequent PVCs - Now on Eliquis 5 bid (does not meet criteria for dose reduction). Started 03/29/19 - stop carvedilol. Start amio as above - Ideally could consider AVN ablation and CRT but age likely prohibitive  3. HTN - SBP 160 here but 120-130 at home  4. Critical AS - plan as above  5. Frequent PVCs - start amio to suppress.   Total time spent 45 minutes. Over half that time spent discussing above. I also d/w Dr. Benay Spice who will see her back in 1-2 months to discuss [place] as needed.   Glori Bickers, MD  12:34 PM

## 2019-04-06 ENCOUNTER — Telehealth (HOSPITAL_COMMUNITY): Payer: Self-pay | Admitting: Cardiology

## 2019-04-06 NOTE — Telephone Encounter (Signed)
Patient left second message on triage line With C/O feeling flushed in the face after starting amiodarone, nausea,and increased SOB   Attempted to return call No answer, unable to leave VM and VM is not set up

## 2019-04-06 NOTE — Telephone Encounter (Signed)
Patient daughter called medication reaction Reports flushed face and nausea   Returned call to patients daughter [phone] Unable to leave message mailbox not set up

## 2019-04-09 NOTE — Telephone Encounter (Signed)
Lets try pacerone again and see if see can tolerate. Unlikely to cause jaundice in short term. That is long term issue

## 2019-04-09 NOTE — Telephone Encounter (Signed)
Patient/ patients daughter called again regarding pacerone Daughter reports she was told by pharmacist to watch for jaundice and flushing After one dose patient did experience flushing in face lasting 30+ mins. The following day daughter restarted coreg.   Ok to continue coreg or should they try pacerone again?   Advised would forward to provider and pharmacist for further review

## 2019-04-11 NOTE — Telephone Encounter (Signed)
Patient aware via Marijean Bravo son in law, voiced understanding.Patient also aware via daughter Orlando Penner.  Advised to return if reactions returned

## 2019-04-19 ENCOUNTER — Other Ambulatory Visit: Payer: Self-pay

## 2019-04-19 ENCOUNTER — Encounter (HOSPITAL_COMMUNITY): Payer: Self-pay | Admitting: Internal Medicine

## 2019-04-19 ENCOUNTER — Ambulatory Visit (HOSPITAL_COMMUNITY)
Admission: RE | Admit: 2019-04-19 | Discharge: 2019-04-19 | Disposition: A | Discharge status: Home / Self Care | Payer: Medicare Other | Source: Ambulatory Visit | Attending: Internal Medicine | Admitting: Internal Medicine

## 2019-04-19 VITALS — BP 130/52 | HR 76 | Wt 141.6 lb

## 2019-04-19 DIAGNOSIS — Z79899 Other long term (current) drug therapy: Secondary | ICD-10-CM | POA: Diagnosis not present

## 2019-04-19 DIAGNOSIS — I447 Left bundle-branch block, unspecified: Secondary | ICD-10-CM | POA: Insufficient documentation

## 2019-04-19 DIAGNOSIS — Z7901 Long term (current) use of anticoagulants: Secondary | ICD-10-CM | POA: Insufficient documentation

## 2019-04-19 DIAGNOSIS — I493 Ventricular premature depolarization: Secondary | ICD-10-CM

## 2019-04-19 DIAGNOSIS — I480 Paroxysmal atrial fibrillation: Secondary | ICD-10-CM

## 2019-04-19 DIAGNOSIS — I110 Hypertensive heart disease with heart failure: Secondary | ICD-10-CM | POA: Insufficient documentation

## 2019-04-19 DIAGNOSIS — I5022 Chronic systolic (congestive) heart failure: Secondary | ICD-10-CM | POA: Diagnosis not present

## 2019-04-19 DIAGNOSIS — I350 Nonrheumatic aortic (valve) stenosis: Secondary | ICD-10-CM

## 2019-04-19 DIAGNOSIS — I4891 Unspecified atrial fibrillation: Secondary | ICD-10-CM | POA: Insufficient documentation

## 2019-04-19 DIAGNOSIS — Z933 Colostomy status: Secondary | ICD-10-CM | POA: Diagnosis not present

## 2019-04-19 DIAGNOSIS — Z8542 Personal history of malignant neoplasm of other parts of uterus: Secondary | ICD-10-CM | POA: Insufficient documentation

## 2019-04-19 DIAGNOSIS — I5021 Acute systolic (congestive) heart failure: Secondary | ICD-10-CM | POA: Insufficient documentation

## 2019-04-19 LAB — BASIC METABOLIC PANEL
Anion gap: 11 (ref 5–15)
BUN: 14 mg/dL (ref 8–23)
CO2: 24 mmol/L (ref 22–32)
Calcium: 9.7 mg/dL (ref 8.9–10.3)
Chloride: 103 mmol/L (ref 98–111)
Creatinine, Ser: 1.14 mg/dL — ABNORMAL HIGH (ref 0.44–1.00)
GFR calc Af Amer: 46 mL/min — ABNORMAL LOW (ref >60)
GFR calc non Af Amer: 40 mL/min — ABNORMAL LOW (ref >60)
Glucose, Bld: 93 mg/dL (ref 70–99)
Potassium: 4 mmol/L (ref 3.5–5.1)
Sodium: 138 mmol/L (ref 135–145)

## 2019-04-19 LAB — BRAIN NATRIURETIC PEPTIDE: B Natriuretic Peptide: 794.3 pg/mL — ABNORMAL HIGH (ref 0.0–100.0)

## 2019-04-19 NOTE — Progress Notes (Signed)
HF CLINIC NOTE  Referring Physician: Dr. Benay Spice   HPI:  Ms. Platania is a 86 y/o woman with h/o HTN, rectal CA (1995) s/p palliative XRT and diverting colostomy referred by Dr. Benay Spice for further evaluation of LE edema.   Denies any h/o known heart disease. Started having LE swelling in past few weeks and also new onset DOE. Saw Dr. Benay Spice in February and had CXR with pulmonary edema and cardiomegaly. ECG with LBBB and frequent PACs.  I saw her for the first time 03/29/19 and was in AF and volume overloaded. Bedside echo ~30%. Started on lasix, carvedilol and Eliquis.   Echo 04/04/19  EF 15-20%. RV ok. Aortic valve heavily calcified. Mean gradient 22. AVA 0.45 cm2 VR 0.18   Returns today for f/u. She is feeling much better. Denies SOB or palpitations. Weight down 10 pounds. LE edema much improved. No CP or dizziness.     Past Medical History:  Diagnosis Date  . Arthritis    fingers  . Cataract 11-15-12   one eye  . Passage of bloody stools 11-15-12   occ.  Marland Kitchen Uterine cancer ([place])    12-15 years ago.  Tx'd with XRT    Current Outpatient Medications  Medication Sig Dispense Refill  . amiodarone (PACERONE) 200 MG tablet Take 1 tablet (200 mg total) by mouth daily. 30 tablet 3  . apixaban (ELIQUIS) 5 MG TABS tablet Take 1 tablet (5 mg total) by mouth 2 (two) times daily. 60 tablet 4  . Cholecalciferol (VITAMIN D-3) 1000 UNITS CAPS Take 1,000 Units by mouth daily.    . furosemide (LASIX) 40 MG tablet Take 1 tablet (40 mg total) by mouth daily. 30 tablet 6  . potassium chloride 20 MEQ/15ML (10%) SOLN Take 15 mLs (20 mEq total) by mouth daily. 473 mL 2  . vitamin B-12 (CYANOCOBALAMIN) 1000 MCG tablet Take 1,000 mcg by mouth daily.     No current facility-administered medications for this encounter.    No Known Allergies    Social History   Socioeconomic History  . Marital status: Widowed    Spouse name: Not on file  . Number of children: 1  . Years of education: Not  on file  . Highest education level: Not on file  Occupational History  . Not on file  Tobacco Use  . Smoking status: Never Smoker  . Smokeless tobacco: Never Used  Substance and Sexual Activity  . Alcohol use: No    Comment: very rare wine  . Drug use: No  . Sexual activity: Not Currently  Other Topics Concern  . Not on file  Social History Narrative   Widowed   Hard of hearing   Lives alone in [place] but comes to [facility] for the winter and stays with daughter   Has only one daughter whom lives in [place]   Social Determinants of Health   Financial Resource Strain:   . Difficulty of Paying Living Expenses:   Food Insecurity:   . Worried About Charity fundraiser in the Last Year:   . Arboriculturist in the Last Year:   Transportation Needs:   . Film/video editor (Medical):   Marland Kitchen Lack of Transportation (Non-Medical):   Physical Activity:   . Days of Exercise per Week:   . Minutes of Exercise per Session:   Stress:   . Feeling of Stress :   Social Connections:   . Frequency of Communication with Friends and Family:   .  Frequency of Social Gatherings with Friends and Family:   . Attends Religious Services:   . Active Member of Clubs or Organizations:   . Attends Archivist Meetings:   Marland Kitchen Marital Status:   Intimate Partner Violence:   . Fear of Current or Ex-Partner:   . Emotionally Abused:   Marland Kitchen Physically Abused:   . Sexually Abused:       Family History  Problem Relation Age of Onset  . Uterine cancer Mother   . Cancer Mother        cant remember    Vitals:   04/19/19 0937  BP: (!) 130/52  Pulse: 76  SpO2: 96%  Weight: 64.2 kg (141 lb 9.6 oz)     PHYSICAL EXAM: General: Elderly sitting in WC No resp difficulty HEENT: normal Neck: supple. JVP 6. Carotids 2+ bilat; + bruits. Delayed carotid upstrokes No lymphadenopathy or thryomegaly appreciated. Cor: PMI nondisplaced. Regular rate & rhythm. 3/6 AS s2 markedly decreased Lungs: clear Abdomen:  soft, nontender, nondistended. No hepatosplenomegaly. No bruits or masses. Good bowel sounds. Extremities: no cyanosis, clubbing, rash, tr-1+ edema L>R  Mild erythema on L  Neuro: alert & orientedx3, cranial nerves grossly intact. moves all 4 extremities w/o difficulty. Affect pleasant   ECG:  pending  ASSESSMENT & PLAN:  1. Acute systolic HF - onset likely 03/2019 - Echo 04/04/19 EF 15-20% with critical AS. RV ok - Suspect LV dysfunction multifactorial (end-stage AS, PAF, PVCs, LBBB) - has responded well to medical therapy and restoration of NSR and PVC suppression with ami  - On echo AS appears critical but exam less so. I reviewed with TAVR team and not felt to be candidate. Will focus on medical therapy - Continue lasix 40 daily + kdur 20 daily (liquid). If weight less than 140 can cut to lasix 40 qod - Continue amio 200 daily in an attempt to reduce AF and PVC burden - Continue compression hose - I am hopeful LV function will improve with suppression of PVCs and AF - See back in 4-6 weeks with echo  2. New onset AF - Remains  in NSR today. Continue amio as above - Now on Eliquis 5 bid (does not meet criteria for dose reduction). Started 03/29/19 - Ideally could consider AVN ablation and CRT but age likely prohibitive  3. HTN - SBP stable. Given age would not be overly aggressive.   4. Critical AS - plan as above  5. Frequent PVCs - continue amio to suppress.   Glori Bickers, MD  10:12 AM

## 2019-04-19 NOTE — Patient Instructions (Signed)
IF weight is <140 lbs decrease Furosemide and Potassium to only taking [place] done today, your results will be available in MyChart, we will contact you for abnormal readings.  Your physician recommends that you schedule a follow-up appointment in: 4-6 weeks with echocardiogram  If you have any questions or concerns before your next appointment please send [place] a message through [place] or call our office at [phone].  At the [facility], you and your health needs are our priority. As part of our continuing mission to provide you with exceptional heart care, we have created designated Provider Care Teams. These Care Teams include your primary Cardiologist (physician) and Advanced Practice Providers (APPs- Physician Assistants and Nurse Practitioners) who all work together to provide you with the care you need, when you need it.   You may see any of the following providers on your designated Care Team at your next follow up: Marland Kitchen Dr Glori Bickers . Dr Loralie Champagne . Darrick Grinder, NP . Lyda Jester, PA . Audry Riles, PharmD   Please be sure to bring in all your medications bottles to every appointment.

## 2019-04-19 NOTE — Addendum Note (Signed)
Encounter addended by: Scarlette Calico, RN on: 04/19/2019 10:47 AM  Actions taken: Order list changed

## 2019-04-24 ENCOUNTER — Telehealth (HOSPITAL_COMMUNITY): Payer: Self-pay | Admitting: *Deleted

## 2019-04-24 MED ORDER — FUROSEMIDE 40 MG PO TABS: 40.0000 mg | ORAL_TABLET | Freq: Every day | ORAL | 6 refills | Status: AC | PRN

## 2019-04-24 MED ORDER — POTASSIUM CHLORIDE 20 MEQ/15ML (10%) PO SOLN: ORAL | 2 refills | Status: AC

## 2019-04-24 NOTE — Telephone Encounter (Signed)
Pts daughter aware and agreeable with plan.  

## 2019-04-24 NOTE — Telephone Encounter (Signed)
That is perfect.  

## 2019-04-24 NOTE — Telephone Encounter (Signed)
Patients daughter called to report pts weight is 130lbs she wants to know if lasix needs to be changed to as needed. During office visit on 3/18 lasix was changed to "Continue lasix 40 daily + kdur 20 daily (liquid). If weight less than 140 can cut to lasix 40 qod"  Routed to [place] for advice

## 2019-05-02 ENCOUNTER — Telehealth (HOSPITAL_COMMUNITY): Payer: Self-pay | Admitting: *Deleted

## 2019-05-02 NOTE — Telephone Encounter (Signed)
Pts daughter left VM stating that patient has been "shaking" a lot and she feels that is due to medications pt is taking. I called back to get more information. No answer/unable to leave VM .

## 2019-05-03 ENCOUNTER — Other Ambulatory Visit: Payer: Self-pay

## 2019-05-03 ENCOUNTER — Inpatient Hospital Stay: Payer: Medicare Other | Attending: Oncology | Admitting: Oncology

## 2019-05-03 VITALS — BP 153/76 | HR 73 | Temp 98.5°F | Resp 17 | Ht 63.5 in | Wt 142.6 lb

## 2019-05-03 DIAGNOSIS — I350 Nonrheumatic aortic (valve) stenosis: Secondary | ICD-10-CM | POA: Insufficient documentation

## 2019-05-03 DIAGNOSIS — I509 Heart failure, unspecified: Secondary | ICD-10-CM | POA: Insufficient documentation

## 2019-05-03 DIAGNOSIS — C20 Malignant neoplasm of rectum: Secondary | ICD-10-CM | POA: Diagnosis present

## 2019-05-03 DIAGNOSIS — Z7901 Long term (current) use of anticoagulants: Secondary | ICD-10-CM | POA: Diagnosis not present

## 2019-05-03 DIAGNOSIS — E8770 Fluid overload, unspecified: Secondary | ICD-10-CM | POA: Diagnosis not present

## 2019-05-03 DIAGNOSIS — I4891 Unspecified atrial fibrillation: Secondary | ICD-10-CM | POA: Diagnosis not present

## 2019-05-03 NOTE — Progress Notes (Signed)
  Stephanie Burns OFFICE PROGRESS NOTE   Diagnosis: Rectal cancer  INTERVAL HISTORY:   Stephanie Burns returns as scheduled.  When I saw her in February she was in congestive heart failure.  She saw Dr. Haroldine Burns and was noted to be in atrial fibrillation.  An echocardiogram found a reduced left ventricular ejection fraction and aortic stenosis.  She was started on Lasix, apixaban, and amiodarone. She lost approximately 10 pounds after being placed on Lasix.  She is now taking the Lasix as needed.  She feels well.  No specific complaint.  No bleeding.  Objective:  Vital signs in last 24 hours:  Blood pressure (!) 153/76, pulse 73, temperature 98.5 F (36.9 C), temperature source Temporal, resp. rate 17, height 5' 3.5" (1.613 m), weight 142 lb 9.6 oz (64.7 kg), SpO2 100 %.    Resp: End inspiratory rales at the posterior base bilaterally, no respiratory distress Cardio: Regular rate and rhythm GI: No hepatosplenomegaly, firm greater than 5 cm nodal mass in the left inguinal area Vascular: Trace edema at the lower leg bilaterally with support stockings in place   Lab Results:  Lab Results  Component Value Date   WBC 6.8 04/04/2019   HGB 12.5 04/04/2019   HCT 41.3 04/04/2019   MCV 99.5 04/04/2019   PLT 270 04/04/2019   NEUTROABS 6.3 03/26/2019    CMP  Lab Results  Component Value Date   NA 138 04/19/2019   K 4.0 04/19/2019   CL 103 04/19/2019   CO2 24 04/19/2019   GLUCOSE 93 04/19/2019   BUN 14 04/19/2019   CREATININE 1.14 (H) 04/19/2019   CALCIUM 9.7 04/19/2019   PROT 6.3 (L) 05/28/2013   ALBUMIN 3.4 (L) 05/28/2013   AST 23 05/28/2013   ALT 11 05/28/2013   ALKPHOS 105 05/28/2013   BILITOT 0.93 05/28/2013   GFRNONAA 40 (L) 04/19/2019   GFRAA 46 (L) 04/19/2019     Medications: I have reviewed the patient's current medications.   Assessment/Plan: 1. Rectal cancer, clinical stage II (uT3, uN0).  Microsatellite stable, no loss of mismatch repair protein  expression.   Trial of Xeloda 1 week on/1 week off beginning around 01/17/2013.   CEA improved 03/06/2013.  Xeloda continued until approximately May of 2015 when she developed a bowel obstruction requiring a diverting colostomy  Initiation of palliative radiation 11/04/2015; completion of palliative radiation 11/17/2015 2. Rectal pressure/urgency secondary to #1. 3. Remote history of uterine cancer treated with surgery and adjuvant radiation. 4. History of Early hand-foot syndrome manifesting with erythema over the palms and soles. 5. Bleeding secondary to progression of rectal/vaginal tumor-improved following palliative radiation 6. Congestive heart failure February 2021 7. New onset atrial fibrillation February 2021 8. Aortic stenosis     Disposition: Stephanie Burns appears well.  She was diagnosed with congestive heart failure, atrial fibrillation, and aortic stenosis in February.  Volume overload has improved with diuresis.  The heart rate is regular today.  She plans to follow-up with Dr. Haroldine Burns on 05/30/2019.  She plans to leave for [place] in May and will return in September.  She to has a history of recurrent tumor in the rectum, not examined recently.  She is now maintained on anticoagulation therapy.  She will hold apixaban and call if she has rectal bleeding.  She will return for an office visit as scheduled in September.    Stephanie Coder, MD  05/03/2019  11:41 AM

## 2019-05-30 ENCOUNTER — Ambulatory Visit (HOSPITAL_BASED_OUTPATIENT_CLINIC_OR_DEPARTMENT_OTHER)
Admission: RE | Admit: 2019-05-30 | Discharge: 2019-05-30 | Disposition: A | Discharge status: Home / Self Care | Payer: Medicare Other | Source: Ambulatory Visit | Attending: Internal Medicine | Admitting: Internal Medicine

## 2019-05-30 ENCOUNTER — Other Ambulatory Visit: Payer: Self-pay

## 2019-05-30 ENCOUNTER — Ambulatory Visit (HOSPITAL_COMMUNITY)
Admission: RE | Admit: 2019-05-30 | Discharge: 2019-05-30 | Disposition: A | Discharge status: Home / Self Care | Payer: Medicare Other | Source: Ambulatory Visit | Attending: Internal Medicine | Admitting: Internal Medicine

## 2019-05-30 ENCOUNTER — Encounter (HOSPITAL_COMMUNITY): Payer: Self-pay | Admitting: Internal Medicine

## 2019-05-30 VITALS — BP 126/72 | HR 78 | Wt 145.8 lb

## 2019-05-30 DIAGNOSIS — Z933 Colostomy status: Secondary | ICD-10-CM | POA: Insufficient documentation

## 2019-05-30 DIAGNOSIS — Z7901 Long term (current) use of anticoagulants: Secondary | ICD-10-CM | POA: Insufficient documentation

## 2019-05-30 DIAGNOSIS — I5022 Chronic systolic (congestive) heart failure: Secondary | ICD-10-CM | POA: Diagnosis not present

## 2019-05-30 DIAGNOSIS — I447 Left bundle-branch block, unspecified: Secondary | ICD-10-CM

## 2019-05-30 DIAGNOSIS — I110 Hypertensive heart disease with heart failure: Secondary | ICD-10-CM | POA: Diagnosis not present

## 2019-05-30 DIAGNOSIS — I10 Essential (primary) hypertension: Secondary | ICD-10-CM

## 2019-05-30 DIAGNOSIS — R0609 Other forms of dyspnea: Secondary | ICD-10-CM | POA: Insufficient documentation

## 2019-05-30 DIAGNOSIS — M1990 Unspecified osteoarthritis, unspecified site: Secondary | ICD-10-CM | POA: Diagnosis not present

## 2019-05-30 DIAGNOSIS — Z79899 Other long term (current) drug therapy: Secondary | ICD-10-CM | POA: Insufficient documentation

## 2019-05-30 DIAGNOSIS — I493 Ventricular premature depolarization: Secondary | ICD-10-CM | POA: Diagnosis not present

## 2019-05-30 DIAGNOSIS — I480 Paroxysmal atrial fibrillation: Secondary | ICD-10-CM

## 2019-05-30 DIAGNOSIS — I5021 Acute systolic (congestive) heart failure: Secondary | ICD-10-CM | POA: Diagnosis present

## 2019-05-30 DIAGNOSIS — I350 Nonrheumatic aortic (valve) stenosis: Secondary | ICD-10-CM

## 2019-05-30 LAB — CBC
HCT: 41.1 % (ref 36.0–46.0)
Hemoglobin: 12.8 g/dL (ref 12.0–15.0)
MCH: 30.7 pg (ref 26.0–34.0)
MCHC: 31.1 g/dL (ref 30.0–36.0)
MCV: 98.6 fL (ref 80.0–100.0)
Platelets: 234 10*3/uL (ref 150–400)
RBC: 4.17 MIL/uL (ref 3.87–5.11)
RDW: 15.6 % — ABNORMAL HIGH (ref 11.5–15.5)
WBC: 5.5 10*3/uL (ref 4.0–10.5)
nRBC: 0 % (ref 0.0–0.2)

## 2019-05-30 LAB — BASIC METABOLIC PANEL
Anion gap: 9 (ref 5–15)
BUN: 15 mg/dL (ref 8–23)
CO2: 23 mmol/L (ref 22–32)
Calcium: 9.3 mg/dL (ref 8.9–10.3)
Chloride: 106 mmol/L (ref 98–111)
Creatinine, Ser: 1.2 mg/dL — ABNORMAL HIGH (ref 0.44–1.00)
GFR calc Af Amer: 43 mL/min — ABNORMAL LOW (ref >60)
GFR calc non Af Amer: 37 mL/min — ABNORMAL LOW (ref >60)
Glucose, Bld: 116 mg/dL — ABNORMAL HIGH (ref 70–99)
Potassium: 3.8 mmol/L (ref 3.5–5.1)
Sodium: 138 mmol/L (ref 135–145)

## 2019-05-30 LAB — BRAIN NATRIURETIC PEPTIDE: B Natriuretic Peptide: 1110.7 pg/mL — ABNORMAL HIGH (ref 0.0–100.0)

## 2019-05-30 MED ORDER — AMIODARONE HCL 100 MG PO TABS: 100.0000 mg | ORAL_TABLET | Freq: Every day | ORAL | 2 refills | Status: AC

## 2019-05-30 MED ORDER — AMIODARONE HCL 100 MG PO TABS: 100.0000 mg | ORAL_TABLET | Freq: Every day | ORAL | 5 refills | Status: DC

## 2019-05-30 NOTE — Progress Notes (Signed)
ADVANCED [facility] CLINIC NOTE  Referring Physician: Dr. Benay Spice   HPI:  Ms. Dalto is a 86 y/o woman with h/o HTN, rectal CA (1995) s/p palliative XRT and diverting colostomy referred by Dr. Benay Spice for further evaluation of LE edema.   Denies any h/o known heart disease. Started having LE swelling in past few weeks and also new onset DOE. Saw Dr. Benay Spice in February and had CXR with pulmonary edema and cardiomegaly. ECG with LBBB and frequent PACs.  I saw her for the first time 03/29/19 and was in AF and volume overloaded. Bedside echo ~20%. Started on lasix, carvedilol and Eliquis.   Echo 04/04/19  EF 15-20%. RV ok. Aortic valve heavily calcified. Mean gradient 22. AVA 0.45 cm2 VR 0.18   Returns today for f/u. Feeling pretty good. Denies SOB, orthopnea or PND but doesn't move around much. Takes lasix and potassium about once a week as needed. Main complaint is increasing tremors. No bleeding with Eliquis. .   Echo today (05/30/19) EF 20-25% RV ok. LBBB dyssynchrony severe AS mean gradient 4mmHG VR 0.16  Past Medical History:  Diagnosis Date  . Arthritis    fingers  . Cataract 11-15-12   one eye  . Passage of bloody stools 11-15-12   occ.  Marland Kitchen Uterine cancer ([place])    12-15 years ago.  Tx'd with XRT    Current Outpatient Medications  Medication Sig Dispense Refill  . amiodarone (PACERONE) 200 MG tablet Take 1 tablet (200 mg total) by mouth daily. 30 tablet 3  . apixaban (ELIQUIS) 5 MG TABS tablet Take 1 tablet (5 mg total) by mouth 2 (two) times daily. 60 tablet 4  . Cholecalciferol (VITAMIN D-3) 1000 UNITS CAPS Take 1,000 Units by mouth daily.    . furosemide (LASIX) 40 MG tablet Take 1 tablet (40 mg total) by mouth daily as needed for fluid or edema. 30 tablet 6  . potassium chloride 20 MEQ/15ML (10%) SOLN Take 33ml with Lasix. 473 mL 2  . vitamin B-12 (CYANOCOBALAMIN) 1000 MCG tablet Take 1,000 mcg by mouth daily.     No current facility-administered medications for this  encounter.    No Known Allergies    Social History   Socioeconomic History  . Marital status: Widowed    Spouse name: Not on file  . Number of children: 1  . Years of education: Not on file  . Highest education level: Not on file  Occupational History  . Not on file  Tobacco Use  . Smoking status: Never Smoker  . Smokeless tobacco: Never Used  Substance and Sexual Activity  . Alcohol use: No    Comment: very rare wine  . Drug use: No  . Sexual activity: Not Currently  Other Topics Concern  . Not on file  Social History Narrative   Widowed   Hard of hearing   Lives alone in [place] but comes to [facility] for the winter and stays with daughter   Has only one daughter whom lives in [place]   Social Determinants of Health   Financial Resource Strain:   . Difficulty of Paying Living Expenses:   Food Insecurity:   . Worried About Charity fundraiser in the Last Year:   . Arboriculturist in the Last Year:   Transportation Needs:   . Film/video editor (Medical):   Marland Kitchen Lack of Transportation (Non-Medical):   Physical Activity:   . Days of Exercise per Week:   . Minutes of  Exercise per Session:   Stress:   . Feeling of Stress :   Social Connections:   . Frequency of Communication with Friends and Family:   . Frequency of Social Gatherings with Friends and Family:   . Attends Religious Services:   . Active Member of Clubs or Organizations:   . Attends Archivist Meetings:   Marland Kitchen Marital Status:   Intimate Partner Violence:   . Fear of Current or Ex-Partner:   . Emotionally Abused:   Marland Kitchen Physically Abused:   . Sexually Abused:       Family History  Problem Relation Age of Onset  . Uterine cancer Mother   . Cancer Mother        cant remember    Vitals:   05/30/19 1440  BP: 126/72  Pulse: 78  SpO2: 91%  Weight: 66.1 kg (145 lb 12.8 oz)     PHYSICAL EXAM: General: Elderly woman sitting in WC No resp difficulty HEENT: normal Neck: supple. JVP 7. Carotids  2+ bilat; + bruits. No lymphadenopathy or thryomegaly appreciated. Cor: PMI nondisplaced. Regular rate & rhythm. 2/6 AS Lungs: clear Abdomen: soft, nontender, nondistended. No hepatosplenomegaly. No bruits or masses. Good bowel sounds. Extremities: no cyanosis, clubbing, rash, 1-2+ L>R edema Neuro: alert & orientedx3, cranial nerves grossly intact. moves all 4 extremities w/o difficulty. Affect pleasant + tremor  ECG:  NSR 81 with PACs 1 AVB 238ms. LBBB 158 ms Personally reviewed   ASSESSMENT & PLAN:  1. Acute systolic HF - onset [id number] - Echo 04/04/19 EF 15-20% with critical AS. RV ok - Echo 05/30/19 EF 20-25% with severe AS RV ok  - Suspect LV dysfunction multifactorial (end-stage AS, PAF, PVCs, LBBB) - From a clinical standpoint has responded well to restoring normal sinus rhythm and PVC suppression with amiodarone. However EF has not recovered as I would have hoped. Thus I suspect LV dysfunction more likely related to LBBB cardiomyopathy, AS or possibly CAD - Given age and comorbidities have decided not to pursue cath, CRT or TAVR - Today volume status is a little elevated and encouraged her to take lasix a bit more. Possibly 2-3x/week as neede - Taking lasix 40 daily + kdur 20 daily (liquid). If weight less than 140 can cut to lasix 40 qod - With increasing tremors will drop amiodarone to 100 daily.  - Continue compression hose - She is going to [place] this weekend and I asked her to find a cardiologist to follow her while she is there in case of emergency. They can call my cell phone if any questions at [phone] - labs today  2. Paroxysmal AF - Remains  in NSR today. Decrease amio to 100 daily due to tremors - Now on Eliquis 5 bid (does not meet criteria for dose reduction). Started 03/29/19 - Ideally could consider AVN ablation and CRT but age likely prohibitive  3. HTN - Blood pressure well controlled. Continue current regimen..   4. Critical AS - plan as above  5. Frequent  PVCs - plan as above  Glori Bickers, MD  3:10 PM Medical Director [facility] [place], [place]

## 2019-05-30 NOTE — Patient Instructions (Signed)
Take Lasix as needed   DECREASE Amiodarone to 100mg  (1 tab) daily   Labs today We will only contact you if something comes back abnormal or we need to make some changes. Otherwise no news is good news!   Your physician wants you to follow-up in: 5 months.  You will receive a reminder letter in the mail two months in advance. If you don't receive a letter, please call our office to schedule the follow-up appointment.   Please call office at [phone] option 2 if you have any questions or concerns.   At the [facility], you and your health needs are our priority. As part of our continuing mission to provide you with exceptional heart care, we have created designated Provider Care Teams. These Care Teams include your primary Cardiologist (physician) and Advanced Practice Providers (APPs- Physician Assistants and Nurse Practitioners) who all work together to provide you with the care you need, when you need it.   You may see any of the following providers on your designated Care Team at your next follow up: Marland Kitchen Dr Glori Bickers . Dr Loralie Champagne . Darrick Grinder, NP . Lyda Jester, PA . Audry Riles, PharmD   Please be sure to bring in all your medications bottles to every appointment.

## 2019-05-30 NOTE — Progress Notes (Signed)
  Echocardiogram 2D Echocardiogram has been performed.  Jennette Dubin 05/30/2019, 2:39 PM

## 2019-05-30 NOTE — Addendum Note (Signed)
Encounter addended by: Scarlette Calico, RN on: 05/30/2019 3:33 PM  Actions taken: Order list changed, Diagnosis association updated

## 2019-05-30 NOTE — Addendum Note (Signed)
Encounter addended by: Valeda Malm, RN on: 05/30/2019 3:36 PM  Actions taken: Charge Capture section accepted, Clinical Note Signed, Pharmacy for encounter modified, Order list changed

## 2019-07-19 ENCOUNTER — Other Ambulatory Visit (HOSPITAL_COMMUNITY): Payer: Self-pay | Admitting: Cardiology

## 2019-07-19 MED ORDER — APIXABAN 5 MG PO TABS: 5.0000 mg | ORAL_TABLET | Freq: Two times a day (BID) | ORAL | 4 refills | Status: DC

## 2019-07-20 ENCOUNTER — Other Ambulatory Visit (HOSPITAL_COMMUNITY): Payer: Self-pay | Admitting: Internal Medicine

## 2019-08-29 ENCOUNTER — Telehealth (HOSPITAL_COMMUNITY): Payer: Self-pay | Admitting: *Deleted

## 2019-08-29 NOTE — Telephone Encounter (Signed)
Last office visit note, ekg, and labs sent to ED in [place] to ED provider [facility] fax # [phone]. Urgent records request patient present in ED in [place].

## 2019-10-05 ENCOUNTER — Other Ambulatory Visit (HOSPITAL_COMMUNITY): Payer: Self-pay | Admitting: Internal Medicine

## 2019-10-18 ENCOUNTER — Telehealth (HOSPITAL_COMMUNITY): Payer: Self-pay | Admitting: Licensed Clinical Social Worker

## 2019-10-18 ENCOUNTER — Telehealth (HOSPITAL_COMMUNITY): Payer: Self-pay | Admitting: Cardiology

## 2019-10-18 ENCOUNTER — Telehealth: Payer: Self-pay | Admitting: *Deleted

## 2019-10-18 NOTE — Telephone Encounter (Signed)
CSW received call from pt daughter requesting that our office put in referral for pt to get hospice at home as she is needing a lot of help right now and has been declining.  They would like [facility] [place].  CSW spoke with clinic staff who will send referral over today.  CSW updated pt daughter and encouraged her to call us back if she doesn't hear anything from hospice by tomorrow.  Will continue to follow and assist as needed  Jorge Ny, [facility] Desk#: [phone] Cell#: [phone]

## 2019-10-18 NOTE — Telephone Encounter (Signed)
Patients daughter called to request referral to hospice Patient recently moved back from [place] After recent hospitalization patient has failure to thrive, will not eat, extreme weakness, and sleeps a lot.

## 2019-10-18 NOTE — Telephone Encounter (Signed)
Called to cancel appointment on 9/20. Patient has transitioned to Hospice. Thank Dr. Benay Spice for all his excellent care.

## 2019-10-22 ENCOUNTER — Ambulatory Visit: Payer: Medicare Other | Admitting: Oncology

## 2019-10-23 ENCOUNTER — Telehealth (HOSPITAL_COMMUNITY): Payer: Self-pay | Admitting: *Deleted

## 2019-10-23 ENCOUNTER — Telehealth: Payer: Self-pay | Admitting: *Deleted

## 2019-10-23 NOTE — Telephone Encounter (Signed)
Stephanie Burns with [facility] called to inform Dr.Bensimhon that patient is deceased.   Callback # [phone]

## 2019-10-23 NOTE — Telephone Encounter (Signed)
Message from Hospice that patient died today. MD notified.

## 2019-11-02 DEATH — deceased

## 2019-12-05 ENCOUNTER — Encounter (HOSPITAL_COMMUNITY): Payer: Medicare Other | Admitting: Internal Medicine

## 2021-11-19 IMAGING — DX DG CHEST 2V
2 series · 2 of 2 positions shown · non-contrast
Comparison: CT chest 11/14/2012

CLINICAL DATA: Rectal cancer, dyspnea, tachycardia, history uterine
cancer

EXAM:
CHEST - 2 VIEW

[chest pa]
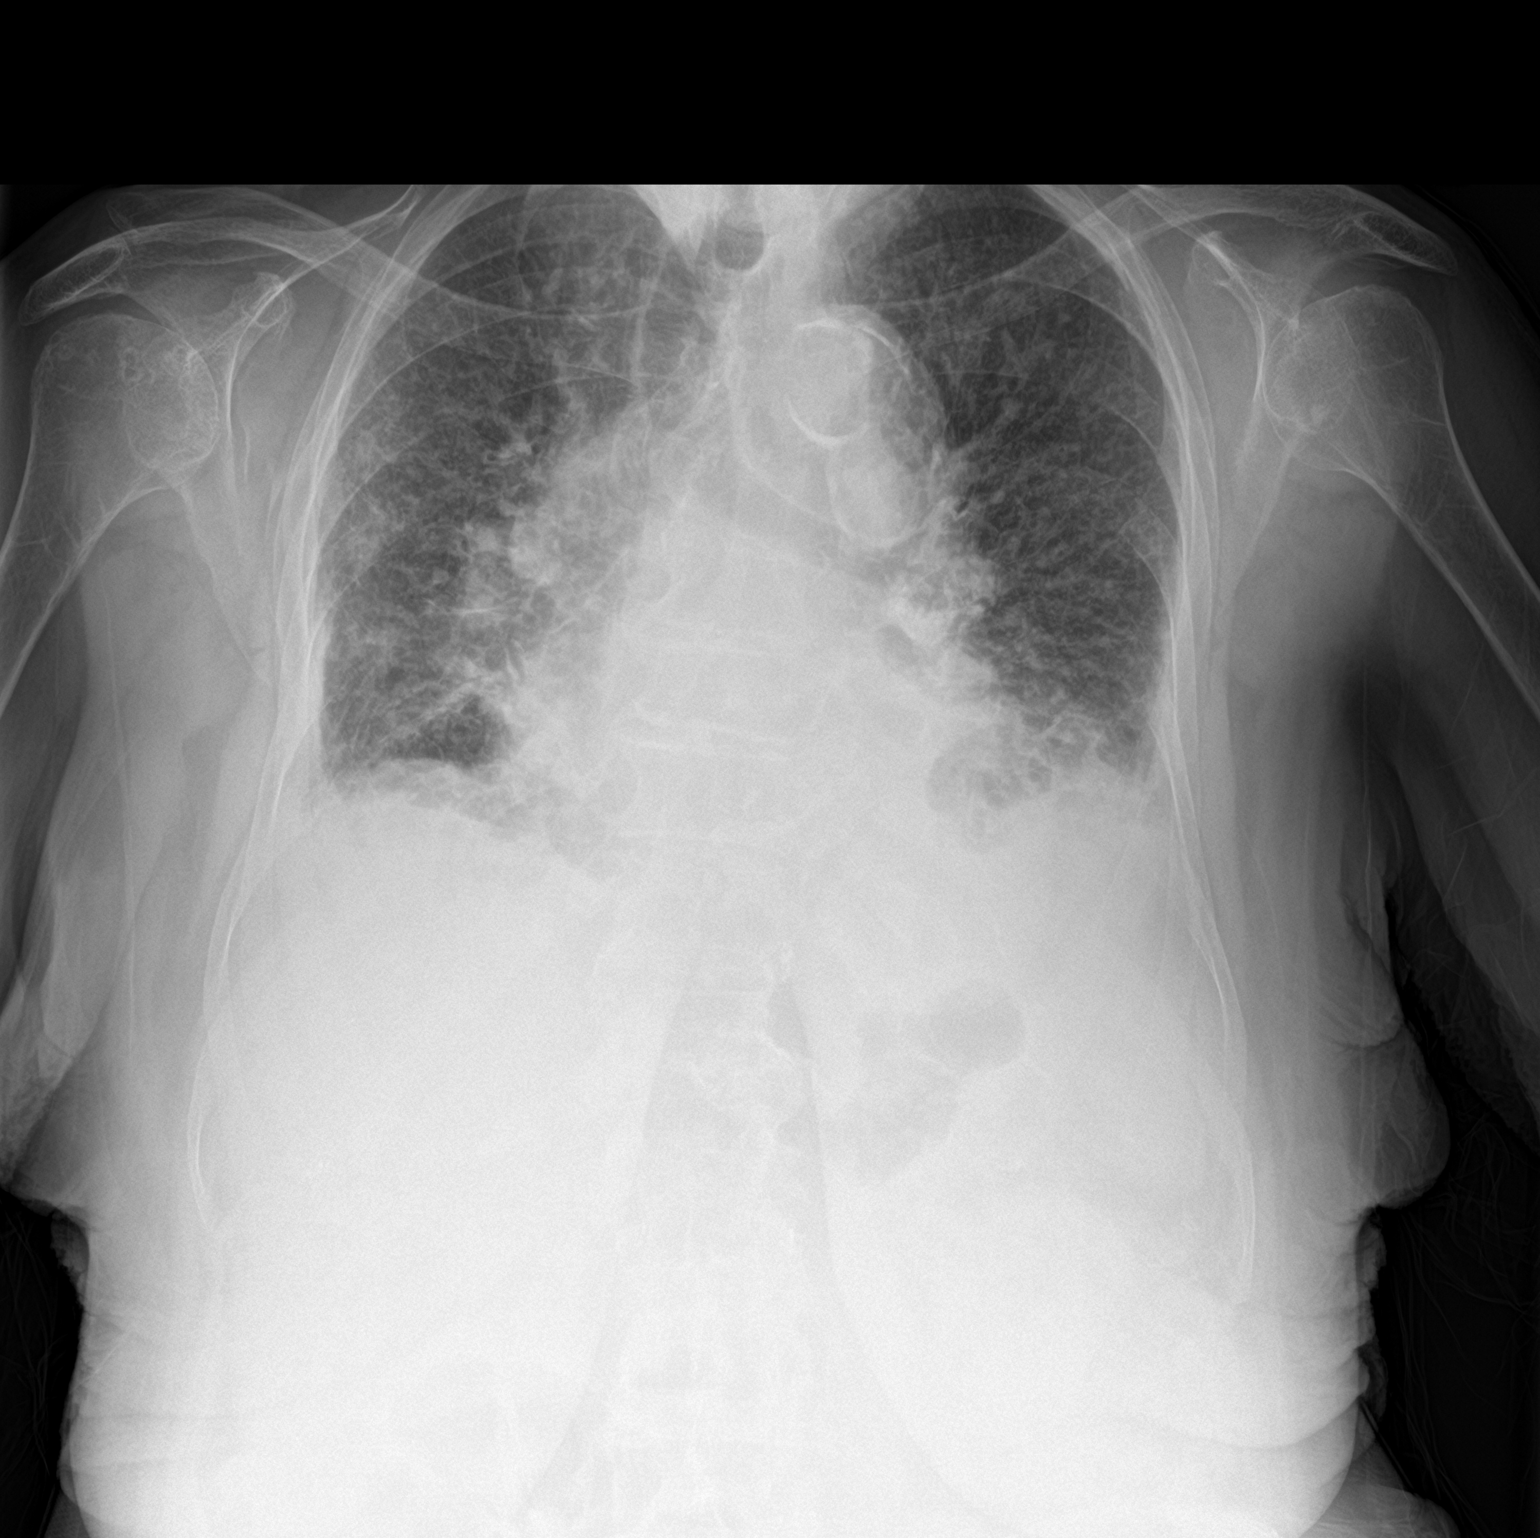

[chest lat]
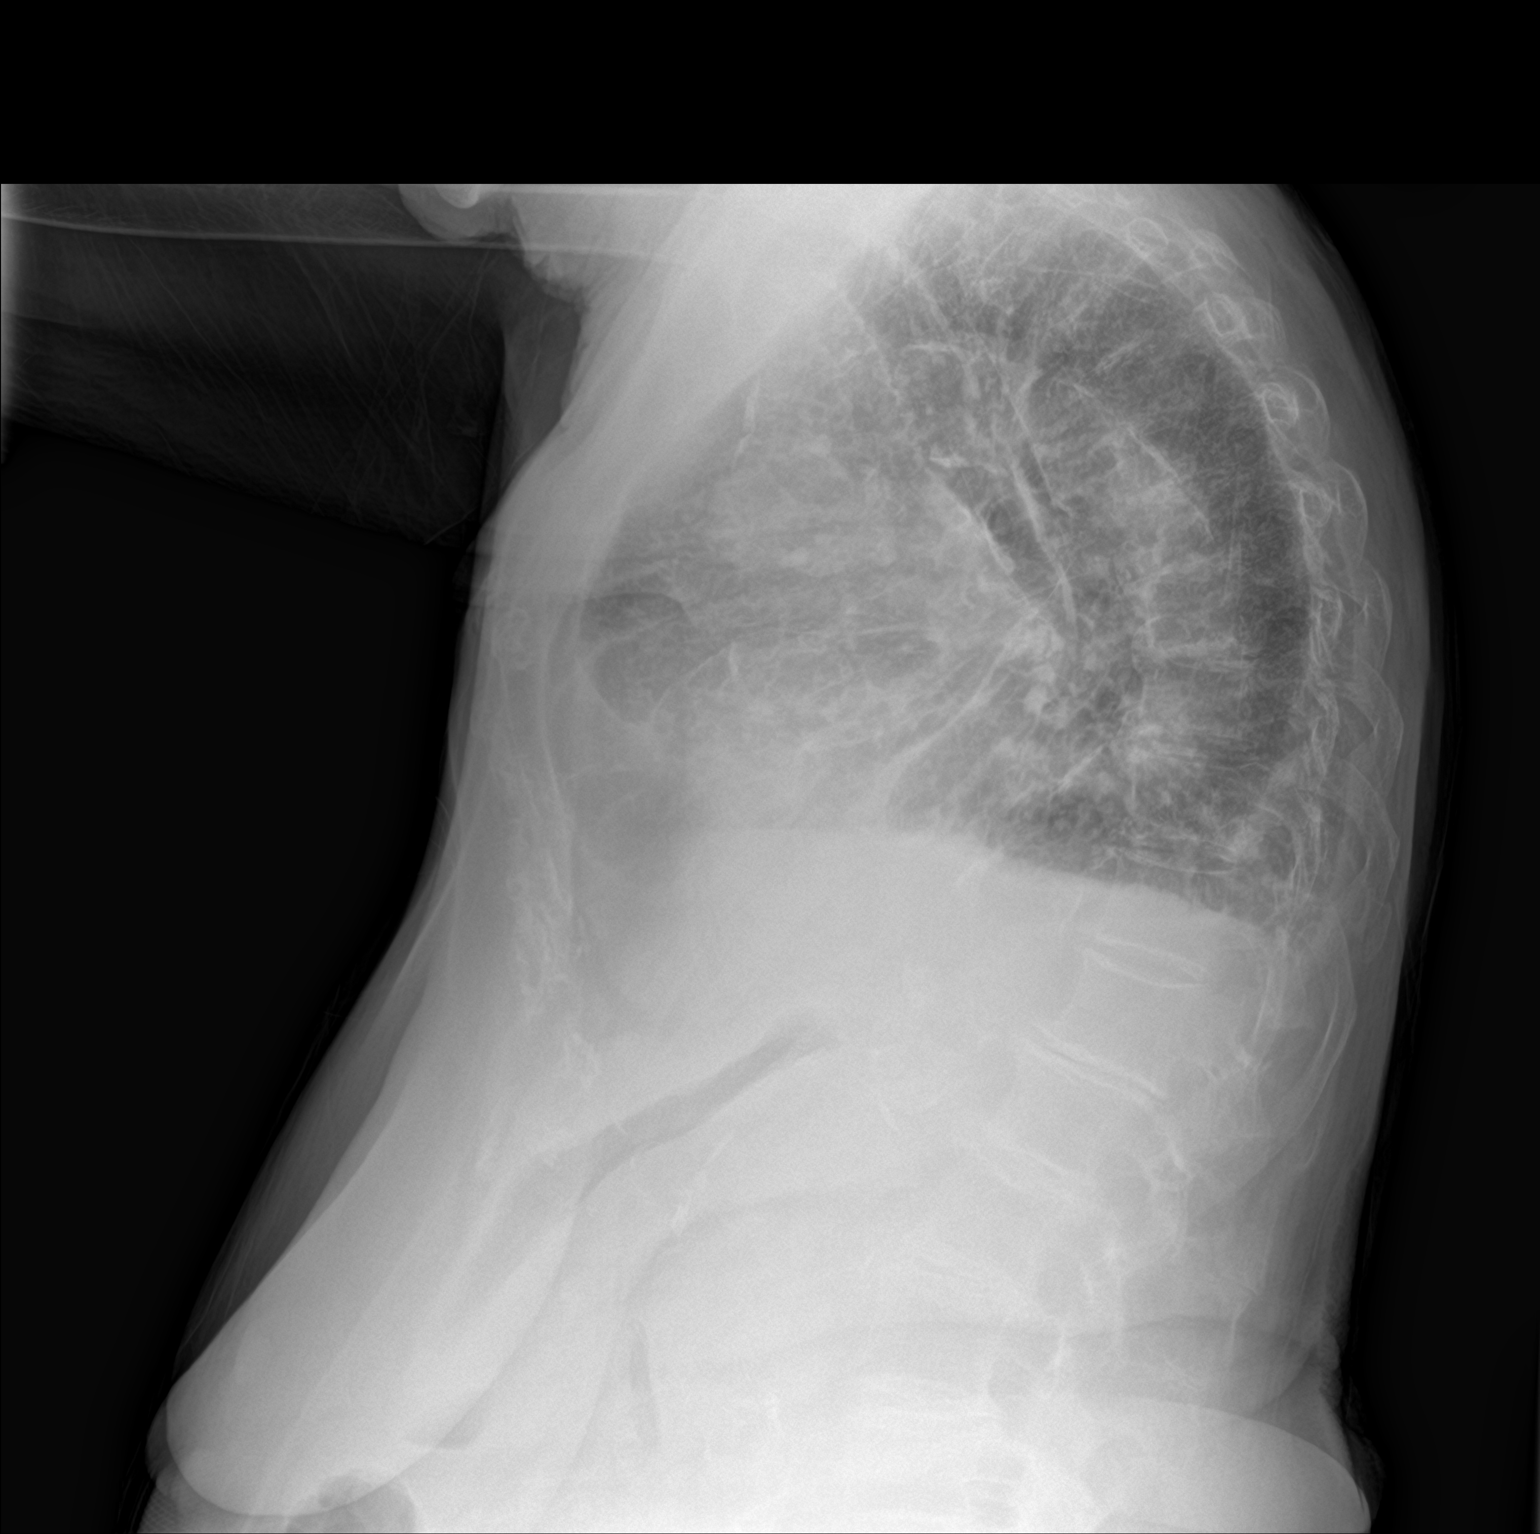

[2 of 2 positions shown; findings below may reference images not displayed]

FINDINGS: Enlargement of cardiac silhouette with pulmonary vascular
congestion.

Calcified tortuous thoracic aorta.

Scattered interstitial infiltrates consistent with pulmonary edema.

Tiny bibasilar effusions and atelectasis.

No pneumothorax.

Bones diffusely demineralized with scattered degenerative changes of
the thoracic spine and mild scoliosis.
IMPRESSION: CHF changes as above.
